# Patient Record
Sex: Male | Born: 1950
Health system: Southern US, Community
[De-identification: ages and names within clinical notes are randomized; demographics above are authoritative.]

## PROBLEM LIST (undated history)

## (undated) HISTORY — PX: HERNIA REPAIR: SHX51

## (undated) HISTORY — PX: JOINT REPLACEMENT: SHX530

---

## 2005-04-30 ENCOUNTER — Ambulatory Visit: Payer: Self-pay | Admitting: Unknown Physician Specialty

## 2005-05-16 ENCOUNTER — Ambulatory Visit: Payer: Self-pay | Admitting: Unknown Physician Specialty

## 2005-06-21 ENCOUNTER — Inpatient Hospital Stay: Payer: Self-pay | Admitting: Unknown Physician Specialty

## 2011-07-26 ENCOUNTER — Ambulatory Visit: Payer: Self-pay | Admitting: General Practice

## 2011-07-29 ENCOUNTER — Ambulatory Visit: Payer: Self-pay | Admitting: General Practice

## 2013-07-15 IMAGING — CT CT ABD-PELV W/ CM
1 of 2 series · 15 of 32 positions shown, 19 images · non-contrast
Comparison: none

REASON FOR EXAM: Wt Loss LLQ Pain
COMMENTS:

[Series 2: abd 3mm w 3.0 i40f 3 · axial · 0.79mm/px · z∈[-230,+216]mm · 15 of 163 slices shown, 19 images]
[im 7/163  soft-tissue]
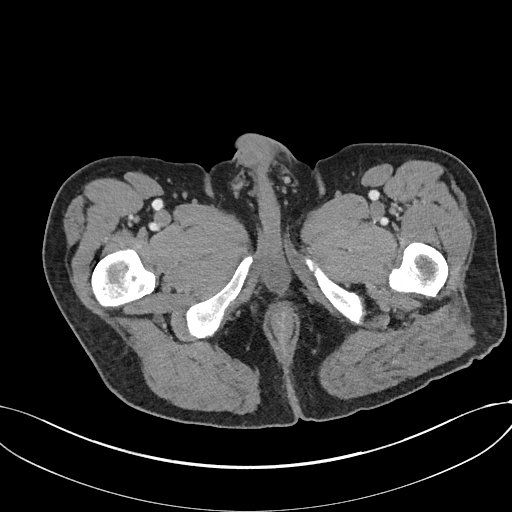
[im 7/163  bone]
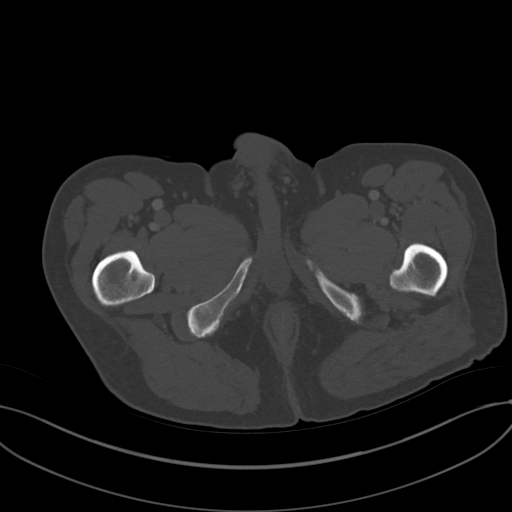
[im 19/163  soft-tissue]
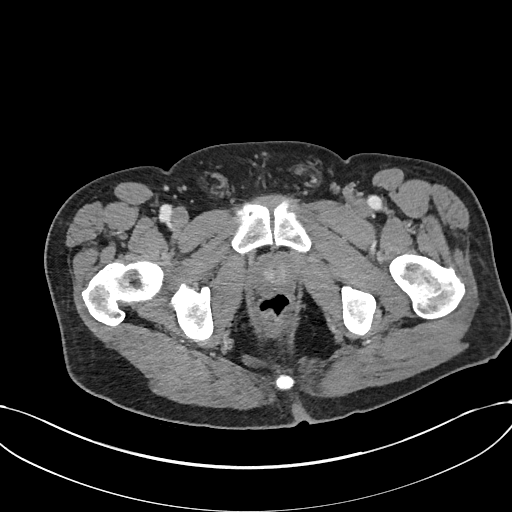
[im 32/163  soft-tissue]
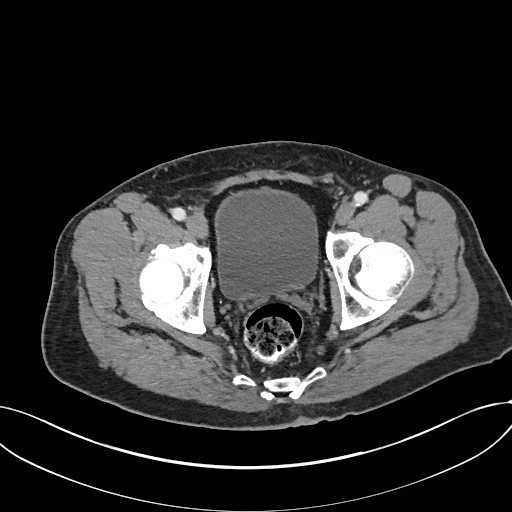
[im 44/163  soft-tissue]
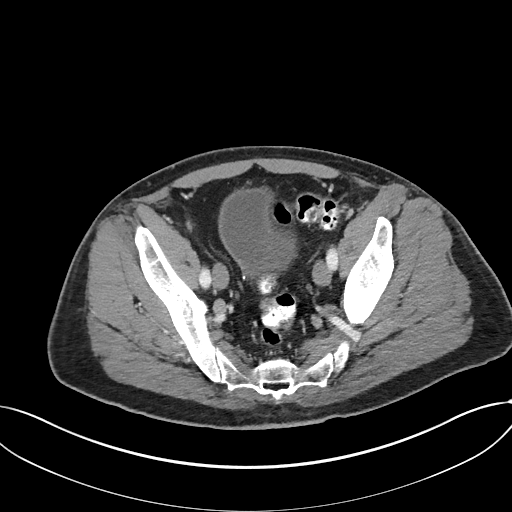
[im 57/163  soft-tissue]
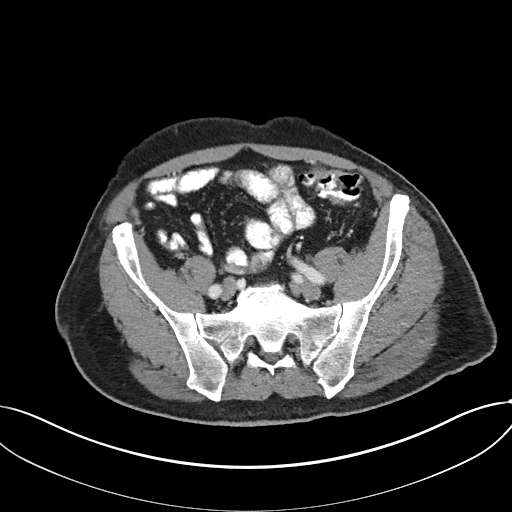
[im 69/163  soft-tissue]
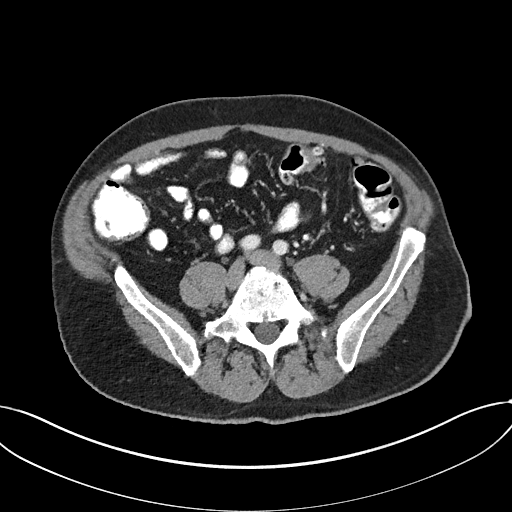
[im 82/163  soft-tissue]
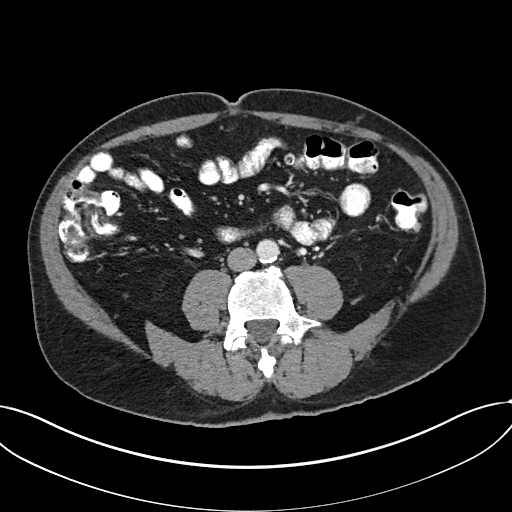
[im 94/163  soft-tissue]
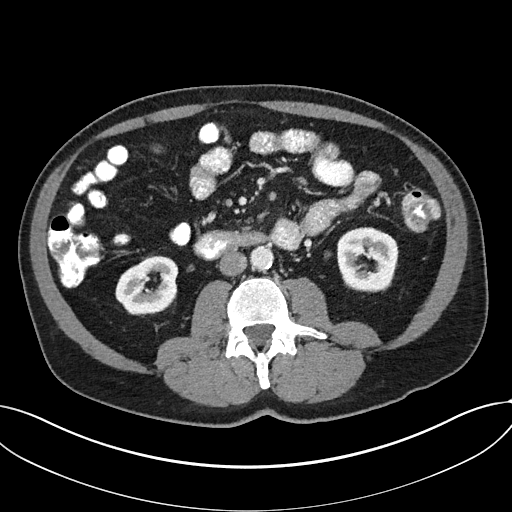
[im 106/163  soft-tissue]
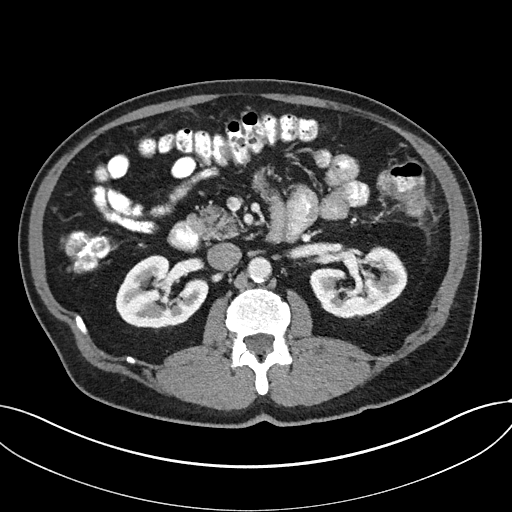
[im 106/163  bone]
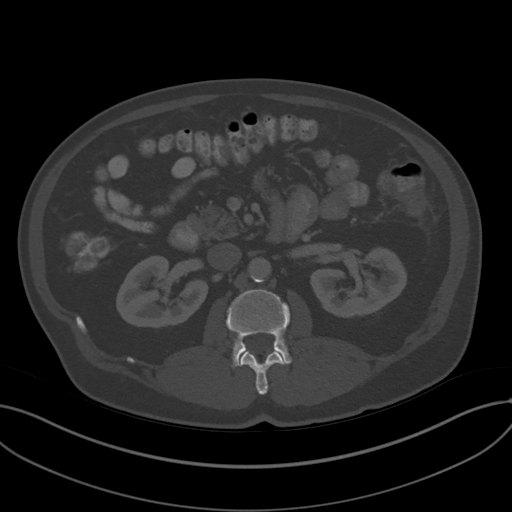
[im 119/163  soft-tissue]
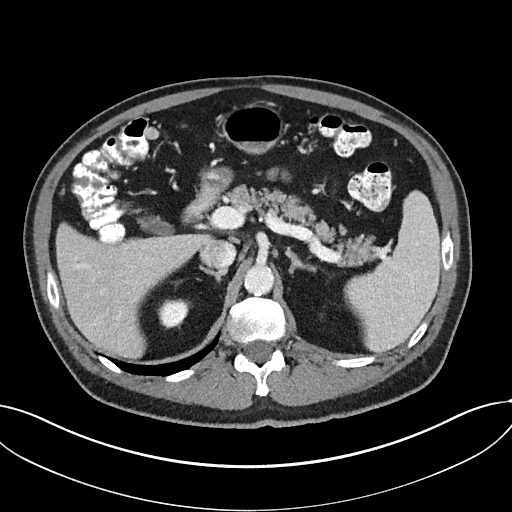
[im 131/163  soft-tissue]
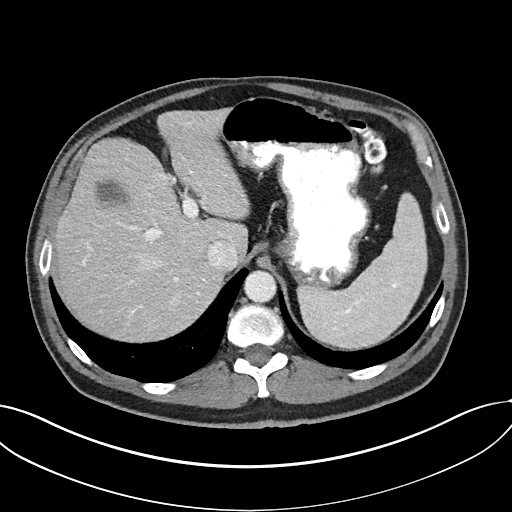
[im 138/163  lung]
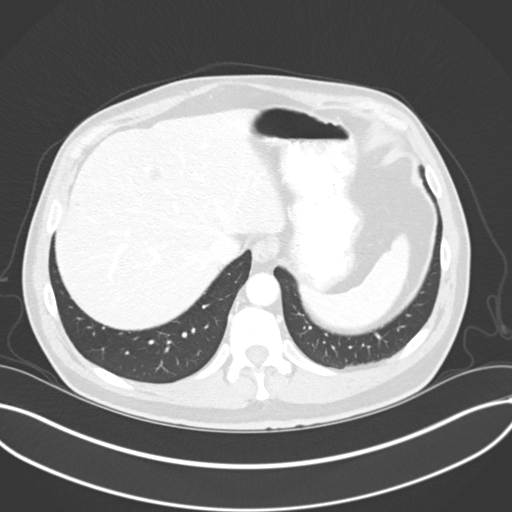
[im 144/163  soft-tissue]
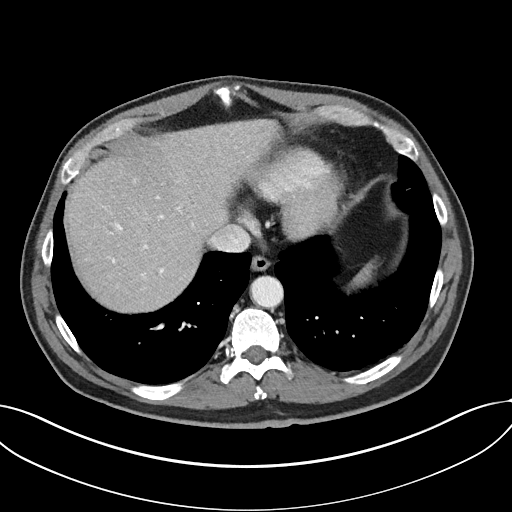
[im 144/163  lung]
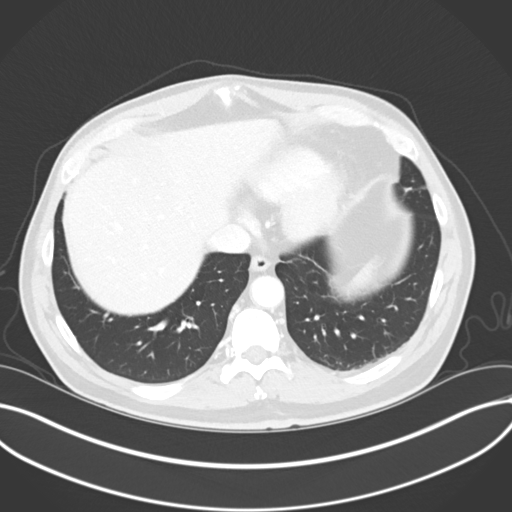
[im 150/163  lung]
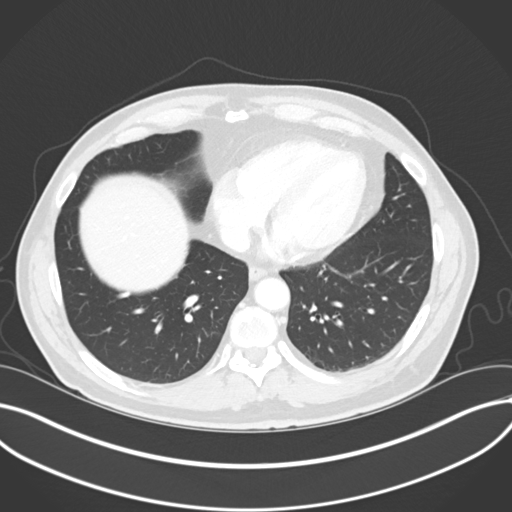
[im 156/163  soft-tissue]
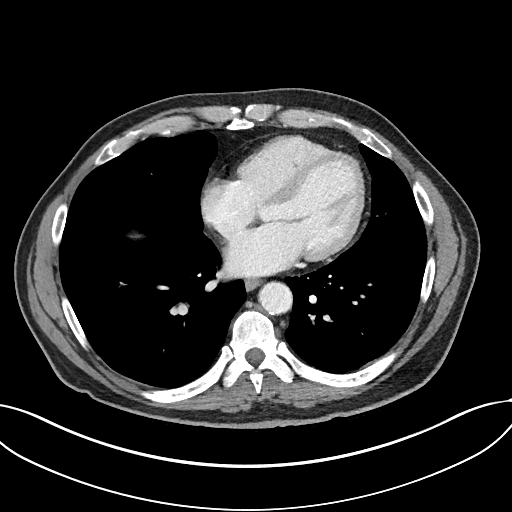
[im 156/163  lung]
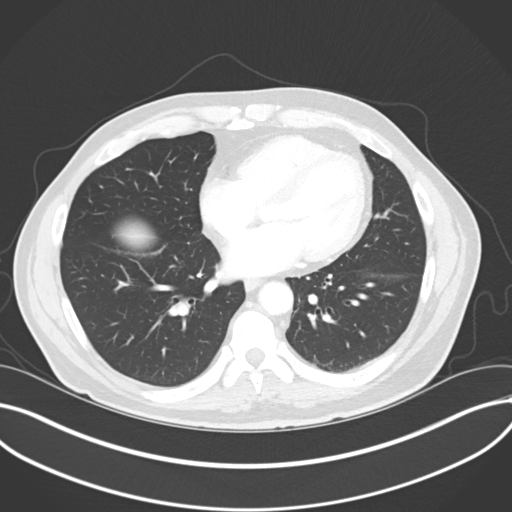

[15 of 32 positions shown; findings below may reference images not displayed]

PROCEDURE:     KCT - KCT ABDOMEN/PELVIS W  - July 29, 2011  [DATE]

RESULT:     Axial CT scanning was performed through the abdomen and pelvis
with reconstructions at 3 mm intervals and slice thicknesses. Review of
multiplanar reconstructed images was performed separately on the VIA
monitor. The patient received 100 cc of [id number] as well as oral contrast
material.

In the proximal descending colon on images 55-68 there is increased density
in the pericolonic fat that is consistent with acute diverticulitis. More
distally still there is an area of incomplete distention of the proximal
sigmoid on image 105. It does not appear obstructed and there are associated
diverticula. More proximally the colon exhibits no evidence of obstruction.
The small bowel loops are normal in appearance.

The liver exhibits scattered hypodensities most compatible with cysts. The
gallbladder is adequately distended with no evidence of stones. The
pancreas, spleen, partially distended stomach, adrenal glands, and kidneys
exhibit no acute abnormality. A mid pole hypodensity in the right kidney
exhibits Hounsfield measurement of +2 and measures 1.5 by 2 cm. The caliber
of the abdominal aorta is normal. The lung bases are clear. The lumbar
vertebral bodies are preserved in height.
IMPRESSION: 1. There are findings that suggest diverticulitis involving the proximal
descending colon. I see no evidence of abscess or perforation or
obstruction. There are scattered diverticula throughout the left colon and
there is an area of incomplete distention in the proximal sigmoid. It may be
useful to consider the patient for surgical consultation given the history
of multiple anabiotic courses.
2. I do not see acute hepatobiliary abnormality nor acute urinary tract
abnormality.

## 2015-09-12 ENCOUNTER — Inpatient Hospital Stay: Payer: BLUE CROSS/BLUE SHIELD

## 2015-09-12 ENCOUNTER — Inpatient Hospital Stay: Payer: BLUE CROSS/BLUE SHIELD | Attending: Internal Medicine | Admitting: Internal Medicine

## 2015-09-12 VITALS — BP 160/89 | HR 83 | Temp 98.9°F | Ht 68.5 in | Wt 182.1 lb

## 2015-09-12 DIAGNOSIS — D473 Essential (hemorrhagic) thrombocythemia: Secondary | ICD-10-CM

## 2015-09-12 DIAGNOSIS — M79674 Pain in right toe(s): Secondary | ICD-10-CM | POA: Insufficient documentation

## 2015-09-12 DIAGNOSIS — D75839 Thrombocytosis, unspecified: Secondary | ICD-10-CM

## 2015-09-12 DIAGNOSIS — Z79899 Other long term (current) drug therapy: Secondary | ICD-10-CM | POA: Insufficient documentation

## 2015-09-12 DIAGNOSIS — Z7982 Long term (current) use of aspirin: Secondary | ICD-10-CM | POA: Insufficient documentation

## 2015-09-12 LAB — CBC WITH DIFFERENTIAL/PLATELET
BASOS ABS: 0.1 10*3/uL (ref 0–0.1)
BASOS PCT: 1 %
EOS ABS: 0.1 10*3/uL (ref 0–0.7)
EOS PCT: 1 %
HCT: 43.9 % (ref 40.0–52.0)
HEMOGLOBIN: 14.7 g/dL (ref 13.0–18.0)
Lymphocytes Relative: 10 %
Lymphs Abs: 0.7 10*3/uL — ABNORMAL LOW (ref 1.0–3.6)
MCH: 30.5 pg (ref 26.0–34.0)
MCHC: 33.6 g/dL (ref 32.0–36.0)
MCV: 90.6 fL (ref 80.0–100.0)
Monocytes Absolute: 0.4 10*3/uL (ref 0.2–1.0)
Monocytes Relative: 5 %
NEUTROS PCT: 83 %
Neutro Abs: 6.2 10*3/uL (ref 1.4–6.5)
PLATELETS: 623 10*3/uL — AB (ref 150–440)
RBC: 4.84 MIL/uL (ref 4.40–5.90)
RDW: 13.8 % (ref 11.5–14.5)
WBC: 7.5 10*3/uL (ref 3.8–10.6)

## 2015-09-12 LAB — LACTATE DEHYDROGENASE: LDH: 152 U/L (ref 98–192)

## 2015-09-12 NOTE — Progress Notes (Signed)
[facility] CONSULT NOTE  No care team member to display  CHIEF COMPLAINTS/PURPOSE OF CONSULTATION:   # June 2015 THROMBOCYTOSIS-   HISTORY OF PRESENTING ILLNESS:  Jordan Burgess 65 y.o.  male  Patient referred to [place] for further evaluation of thrombocytosis.  On review of patient's labs-  Patient noted to have isolated thrombocytosis in June of 2015 minutes platelets of 453 [ Normal less than 380];  In February 2016 it increased to 511;  More recently February 2017-  Platelet count is 605. Patient's white count and hemoglobin is within normal limits.   Patient denies any weight loss denies any unusual fevers or chills. Denies any  Night sweats. His appetite is good.  He is currently on antibiotics for " possible diverticulitis".   otherwise no fatigue.  No history of any blood clots.  Patient does complain of crampy/ burning pain in his  Right toes for The last many months.  ROS: A complete 10 point review of system is done which is negative except mentioned above in history of present illness  MEDICAL HISTORY:  No past medical history on file.  SURGICAL HISTORY: No past surgical history on file.   SOCIAL HISTORY: no smoking, occasional alcohol.  Retired from city of [employer];  Social History   Social History  . Marital Status: Married    Spouse Name: N/A  . Number of Children: N/A  . Years of Education: N/A   Occupational History  . Not on file.   Social History Main Topics  . Smoking status: Not on file  . Smokeless tobacco: Not on file  . Alcohol Use: Not on file  . Drug Use: Not on file  . Sexual Activity: Not on file   Other Topics Concern  . Not on file   Social History Narrative  . No narrative on file    FAMILY HISTORY: No family history on file.  ALLERGIES:  has no allergies on file.  MEDICATIONS:  Current Outpatient Prescriptions  Medication Sig Dispense Refill  . aspirin 81 MG tablet Take 81 mg by mouth daily.    . ciprofloxacin  (CIPRO) 500 MG tablet Take 500 mg by mouth 2 (two) times daily. Take for 10 days for diverticulitis    . ibuprofen (ADVIL,MOTRIN) 200 MG tablet Take 200 mg by mouth every 6 (six) hours as needed.    . metroNIDAZOLE (FLAGYL) 500 MG tablet Take 500 mg by mouth 3 (three) times daily.     No current facility-administered medications for this visit.      Marland Kitchen  PHYSICAL EXAMINATION: ECOG PERFORMANCE STATUS: 0 - Asymptomatic  Filed Vitals:   09/12/15 1413  BP: 160/89  Pulse: 83  Temp: 98.9 F (37.2 C)   Filed Weights   09/12/15 1413  Weight: 182 lb 1.6 oz (82.6 kg)    GENERAL: Well-nourished well-developed; Alert, no distress and comfortable.   Alone.  EYES: no pallor or icterus OROPHARYNX: no thrush or ulceration; good dentition  NECK: supple, no masses felt LYMPH:  no palpable lymphadenopathy in the cervical, axillary or inguinal regions LUNGS: clear to auscultation and  No wheeze or crackles HEART/CVS: regular rate & rhythm and no murmurs; No lower extremity edema ABDOMEN: abdomen soft, non-tender and normal bowel sounds Musculoskeletal:no cyanosis of digits and no clubbing  PSYCH: alert & oriented x 3 with fluent speech NEURO: no focal motor/sensory deficits SKIN:  no rashes or significant lesions   ASSESSMENT & PLAN:   # THROMBOCYTOSIS-  The gradual  increase the patient's platelet count from 2015-  Most recent 605 in February 2017-  Is suggestive of primary bone marrow process like  Essential thrombocytosis.  Recommend checking CBC; LDH.  Also check jack 2 mutation/ MPL/ CALR on peripheral blood; also check bcr-abl.  Also discussed that patient re: a bone marrow biopsy/  And also CT of the abdomen and pelvis based upon  Above workup.  #  For now I recommend baby aspirin once a day.  #  Patient will follow-up with me in approximately  2 weeks or so to review the results of the above blood work/ next plan of care.  Thank you Dr.Rabinowitz for allowing me to participate in the  care of your pleasant patient. Please do not hesitate to contact me with questions or concerns in the interim.  # 30 minutes face-to-face with the patient discussing the above plan of care; more than 50% of time spent on counseling and coordination.      Cammie Sickle, MD 09/12/2015 2:38 PM

## 2015-09-12 NOTE — Progress Notes (Signed)
New patient evaluation for Thrombocytosis. Feels well. No complaints other then diverticulitis. Last colonoscopy was in June 2016.Taking antibiotics for it now. Mild abdominal tenderness at times. Hx of chronic Bilateral shoulder pain. Has had surgery in past.

## 2015-09-15 ENCOUNTER — Other Ambulatory Visit: Payer: Self-pay | Admitting: Internal Medicine

## 2015-09-15 DIAGNOSIS — D75839 Thrombocytosis, unspecified: Secondary | ICD-10-CM

## 2015-09-15 DIAGNOSIS — D473 Essential (hemorrhagic) thrombocythemia: Secondary | ICD-10-CM

## 2015-09-19 ENCOUNTER — Inpatient Hospital Stay: Payer: BLUE CROSS/BLUE SHIELD

## 2015-09-19 DIAGNOSIS — D473 Essential (hemorrhagic) thrombocythemia: Secondary | ICD-10-CM

## 2015-09-19 DIAGNOSIS — D75839 Thrombocytosis, unspecified: Secondary | ICD-10-CM

## 2015-09-19 LAB — MPL MUTATION ANALYSIS

## 2015-09-19 LAB — BCR-ABL1 FISH
CELLS COUNTED: 200
Cells Analyzed: 200

## 2015-09-22 LAB — JAK2 GENOTYPR

## 2015-09-26 ENCOUNTER — Inpatient Hospital Stay: Payer: BLUE CROSS/BLUE SHIELD | Attending: Internal Medicine | Admitting: Internal Medicine

## 2015-09-26 VITALS — BP 159/85 | HR 88 | Temp 97.1°F | Resp 18 | Wt 179.7 lb

## 2015-09-26 DIAGNOSIS — Z79899 Other long term (current) drug therapy: Secondary | ICD-10-CM | POA: Diagnosis not present

## 2015-09-26 DIAGNOSIS — R202 Paresthesia of skin: Secondary | ICD-10-CM

## 2015-09-26 DIAGNOSIS — Z7982 Long term (current) use of aspirin: Secondary | ICD-10-CM | POA: Insufficient documentation

## 2015-09-26 DIAGNOSIS — R10819 Abdominal tenderness, unspecified site: Secondary | ICD-10-CM | POA: Diagnosis not present

## 2015-09-26 DIAGNOSIS — Z792 Long term (current) use of antibiotics: Secondary | ICD-10-CM

## 2015-09-26 DIAGNOSIS — D75839 Thrombocytosis, unspecified: Secondary | ICD-10-CM

## 2015-09-26 DIAGNOSIS — F17210 Nicotine dependence, cigarettes, uncomplicated: Secondary | ICD-10-CM | POA: Insufficient documentation

## 2015-09-26 DIAGNOSIS — K5792 Diverticulitis of intestine, part unspecified, without perforation or abscess without bleeding: Secondary | ICD-10-CM | POA: Diagnosis not present

## 2015-09-26 DIAGNOSIS — R200 Anesthesia of skin: Secondary | ICD-10-CM | POA: Insufficient documentation

## 2015-09-26 DIAGNOSIS — D473 Essential (hemorrhagic) thrombocythemia: Secondary | ICD-10-CM | POA: Insufficient documentation

## 2015-09-26 NOTE — Progress Notes (Signed)
[facility] CONSULT NOTE  No care team member to display  CHIEF COMPLAINTS/PURPOSE OF CONSULTATION:   # June 2015 THROMBOCYTOSIS- [510 to 623] JAK-2 NEG;MPL-NEG; Bcr-abl-Negative.   HISTORY OF PRESENTING ILLNESS:  Jordan Burgess 65 y.o.  male  is here to discuss the results of the labs that were ordered for his thrombocytosis.  Patient is currently on antibiotics for possible diverticulitis. He has mild abdominal tenderness. Otherwise no fevers no blood in stools. Patient has intermittent/once in a while tingling and numbness sensation in his feet bilaterally.  Oherwise no fatigue.  No history of any blood clots.    ROS: A complete 10 point review of system is done which is negative except mentioned above in history of present illness  MEDICAL HISTORY:  No past medical history on file.  SURGICAL HISTORY: No past surgical history on file.   SOCIAL HISTORY: no smoking, occasional alcohol.  Retired from city of [employer];  Social History   Social History  . Marital Status: Married    Spouse Name: N/A  . Number of Children: N/A  . Years of Education: N/A   Occupational History  . Not on file.   Social History Main Topics  . Smoking status: Not on file  . Smokeless tobacco: Not on file  . Alcohol Use: Not on file  . Drug Use: Not on file  . Sexual Activity: Not on file   Other Topics Concern  . Not on file   Social History Narrative  . No narrative on file    FAMILY HISTORY: No family history on file.  ALLERGIES:  has no allergies on file.  MEDICATIONS:  Current Outpatient Prescriptions  Medication Sig Dispense Refill  . aspirin 81 MG tablet Take 81 mg by mouth daily.    . ciprofloxacin (CIPRO) 500 MG tablet Take 500 mg by mouth 2 (two) times daily. Take for 10 days for diverticulitis    . ibuprofen (ADVIL,MOTRIN) 200 MG tablet Take 200 mg by mouth every 6 (six) hours as needed.    . metroNIDAZOLE (FLAGYL) 500 MG tablet Take 500 mg by mouth 3  (three) times daily.     No current facility-administered medications for this visit.      Marland Kitchen  PHYSICAL EXAMINATION: ECOG PERFORMANCE STATUS: 0 - Asymptomatic  Filed Vitals:   09/26/15 1058  BP: 159/85  Pulse: 88  Temp: 97.1 F (36.2 C)  Resp: 18   Filed Weights   09/26/15 1058  Weight: 179 lb 10.8 oz (81.5 kg)    GENERAL: Well-nourished well-developed; Alert, no distress and comfortable.   Accompanied by his wife.  EYES: no pallor or icterus OROPHARYNX: no thrush or ulceration; good dentition  NECK: supple, no masses felt LYMPH:  no palpable lymphadenopathy in the cervical, axillary or inguinal regions LUNGS: clear to auscultation and  No wheeze or crackles HEART/CVS: regular rate & rhythm and no murmurs; No lower extremity edema ABDOMEN: abdomen soft, non-tender and normal bowel sounds Musculoskeletal:no cyanosis of digits and no clubbing  PSYCH: alert & oriented x 3 with fluent speech NEURO: no focal motor/sensory deficits SKIN:  no rashes or significant lesions   ASSESSMENT & PLAN:   # THROMBOCYTOSIS-  The gradual increase the patient's platelet count from 2015-  Most recent 605 in February 2017; repeat platelets were 623-  Is suggestive of primary bone marrow process like  Essential thrombocytosis; less likely myelofibrosis. Barnabas Lister 2/MPL negative; BCR ABL negative.   Recommend a bone marrow biopsy- in radiology. Discussed  the potential complications of bleeding/ infection and pain with the procedure. He prefers to have it only under local anesthesia.   # Patient follow-up with me in approximately 2 weeks after the procedure.  # 15 minutes face-to-face with the patient discussing the above plan of care; more than 50% of time spent on natural history; counseling and coordination.      Cammie Sickle, MD 09/26/2015 11:23 AM

## 2015-09-26 NOTE — Progress Notes (Signed)
Pt here for thrombocytosis. Here to receive all of his lab  Results. Pt currently being Tx  For diverticulitis. Appetite of coarse has been low.Has abd pain rates it a 3/10.

## 2015-09-28 ENCOUNTER — Telehealth: Payer: Self-pay | Admitting: *Deleted

## 2015-09-28 NOTE — Telephone Encounter (Signed)
Called pt to tell him about BM bx dat of 10/02/15 [facility] and have bx at 11 am.  He had already been contacted and gave instructions of what to do by scheduling earlier this afternoon.  I did tell him that he can cont. [facility]a.  I also asked if he had a ride and his wife is bring him. [facility] and he know nothing to eat or dink 8 hours before proc.I have made him a f/u appt in [place] office because he only lives 10 min from there.  10/16/15 11 am to see  Dr. Jacinto Reap  I have sent message to Mickel Baas to make the appt and he wants it in [place].I sent message because pt was seen in [place] and the bm bx was ordered from [facility] and I got a message that laura needs to know when the bx was scheduled so she can make f/u appt.

## 2015-09-29 ENCOUNTER — Other Ambulatory Visit: Payer: Self-pay | Admitting: General Surgery

## 2015-10-02 ENCOUNTER — Ambulatory Visit
Admission: RE | Admit: 2015-10-02 | Discharge: 2015-10-02 | Disposition: A | Discharge status: Home / Self Care | Payer: BLUE CROSS/BLUE SHIELD | Source: Ambulatory Visit | Attending: Internal Medicine | Admitting: Internal Medicine

## 2015-10-02 DIAGNOSIS — D473 Essential (hemorrhagic) thrombocythemia: Secondary | ICD-10-CM | POA: Diagnosis present

## 2015-10-02 DIAGNOSIS — D75839 Thrombocytosis, unspecified: Secondary | ICD-10-CM

## 2015-10-02 LAB — CBC
HCT: 44.1 % (ref 40.0–52.0)
HEMOGLOBIN: 15.1 g/dL (ref 13.0–18.0)
MCH: 31 pg (ref 26.0–34.0)
MCHC: 34.2 g/dL (ref 32.0–36.0)
MCV: 90.5 fL (ref 80.0–100.0)
Platelets: 736 10*3/uL — ABNORMAL HIGH (ref 150–440)
RBC: 4.87 MIL/uL (ref 4.40–5.90)
RDW: 13.6 % (ref 11.5–14.5)
WBC: 6.3 10*3/uL (ref 3.8–10.6)

## 2015-10-02 LAB — DIFFERENTIAL
BASOS ABS: 0.1 10*3/uL (ref 0–0.1)
Basophils Relative: 1 %
Eosinophils Absolute: 0.1 10*3/uL (ref 0–0.7)
Eosinophils Relative: 2 %
LYMPHS ABS: 0.8 10*3/uL — AB (ref 1.0–3.6)
LYMPHS PCT: 14 %
Monocytes Absolute: 0.4 10*3/uL (ref 0.2–1.0)
Monocytes Relative: 6 %
NEUTROS ABS: 4.9 10*3/uL (ref 1.4–6.5)
NEUTROS PCT: 77 %

## 2015-10-02 LAB — PROTIME-INR
INR: 1.14
Prothrombin Time: 14.8 seconds (ref 11.4–15.0)

## 2015-10-02 LAB — APTT: APTT: 29 s (ref 24–36)

## 2015-10-02 MED ORDER — MIDAZOLAM HCL 5 MG/5ML IJ SOLN
INTRAMUSCULAR | Status: AC | PRN
  Administered 2015-10-02: 1 mg via INTRAVENOUS

## 2015-10-02 MED ORDER — HYDROCODONE-ACETAMINOPHEN 5-325 MG PO TABS
1.0000 | ORAL_TABLET | ORAL | Status: DC | PRN
  Filled 2015-10-02: qty 2

## 2015-10-02 MED ORDER — SODIUM CHLORIDE 0.9 % IV SOLN
INTRAVENOUS | Status: DC
  Administered 2015-10-02: 11:00:00 via INTRAVENOUS

## 2015-10-02 MED ORDER — FENTANYL CITRATE (PF) 100 MCG/2ML IJ SOLN
INTRAMUSCULAR | Status: AC | PRN
  Administered 2015-10-02: 50 ug via INTRAVENOUS

## 2015-10-02 MED ORDER — FENTANYL CITRATE (PF) 100 MCG/2ML IJ SOLN
INTRAMUSCULAR | Status: AC
  Filled 2015-10-02: qty 4

## 2015-10-02 MED ORDER — MIDAZOLAM HCL 5 MG/5ML IJ SOLN
INTRAMUSCULAR | Status: AC
  Filled 2015-10-02: qty 5

## 2015-10-02 MED ORDER — HEPARIN SOD (PORK) LOCK FLUSH 100 UNIT/ML IV SOLN
INTRAVENOUS | Status: AC
  Filled 2015-10-02: qty 5

## 2015-10-02 NOTE — Procedures (Signed)
Under CT guidance, bone marrow aspirate and core biopsy of right iliac bone was performed. No immediate complications.

## 2015-10-02 NOTE — Procedures (Signed)
Procedure and risks discussed with patient and wife. Informed consent was obtained. Will perform CT-guided bone marrow biopsy.

## 2015-10-16 ENCOUNTER — Inpatient Hospital Stay (HOSPITAL_BASED_OUTPATIENT_CLINIC_OR_DEPARTMENT_OTHER): Payer: BLUE CROSS/BLUE SHIELD | Admitting: Internal Medicine

## 2015-10-16 VITALS — BP 151/84 | HR 71 | Temp 96.6°F | Resp 16 | Wt 184.1 lb

## 2015-10-16 DIAGNOSIS — D75839 Thrombocytosis, unspecified: Secondary | ICD-10-CM

## 2015-10-16 DIAGNOSIS — F17210 Nicotine dependence, cigarettes, uncomplicated: Secondary | ICD-10-CM

## 2015-10-16 DIAGNOSIS — Z7982 Long term (current) use of aspirin: Secondary | ICD-10-CM | POA: Diagnosis not present

## 2015-10-16 DIAGNOSIS — Z79899 Other long term (current) drug therapy: Secondary | ICD-10-CM

## 2015-10-16 DIAGNOSIS — D473 Essential (hemorrhagic) thrombocythemia: Secondary | ICD-10-CM

## 2015-10-16 MED ORDER — HYDROXYUREA 500 MG PO CAPS: 500.0000 mg | ORAL_CAPSULE | Freq: Every day | ORAL | Status: DC

## 2015-10-16 NOTE — Progress Notes (Signed)
Patient is here to discuss test results.

## 2015-10-16 NOTE — Progress Notes (Signed)
[facility] NOTE  Patient Care Team: Karen Kitchens, MD as PCP - General (Family Medicine)  CHIEF COMPLAINTS/PURPOSE OF CONSULTATION:   # MARCH 2017- ESSENTIAL THROMBOCYTOSIS-CALR- POSITIVE; Intermediate risk [March 2017- BMBx- megakaryocytosis; mild reticulin fibrosis ; platelets- 510 to 623- June 2015] JAK-2 NEG;MPL-NEG; Bcr-abl-Negative.CALR MUTATION POSITIVE. [id number]- START Hydrea 58m/day  HISTORY OF PRESENTING ILLNESS:  Jordan TESTERMAN89y.o.  male  is here to discuss the results of the workup including bone marrow biopsy that was ordered for his elevated platelets.   Bone marrow biopsy was uneventful. No history of any blood clots.  Patient is on aspirin once a day.  ROS: A complete 10 point review of system is done which is negative except mentioned above in history of present illness  MEDICAL HISTORY:  No past medical history on file.  SURGICAL HISTORY: Past Surgical History  Procedure Laterality Date  . Joint replacement    . Hernia repair Left      SOCIAL HISTORY: no smoking, occasional alcohol.  Retired from city of [employer]  Social History   Social History  . Marital Status: Married    Spouse Name: N/A  . Number of Children: N/A  . Years of Education: N/A   Occupational History  . Not on file.   Social History Main Topics  . Smoking status: Current Every Day Smoker -- 0.25 packs/day for 5 years  . Smokeless tobacco: Former [employer] developer . Alcohol Use: 4.2 oz/week    7 Cans of beer per week  . Drug Use: No  . Sexual Activity: Not on file   Other Topics Concern  . Not on file   Social History Narrative  . No narrative on file    FAMILY HISTORY: No family history on file.  ALLERGIES:  has No Known Allergies.  MEDICATIONS:  Current Outpatient Prescriptions  Medication Sig Dispense Refill  . aspirin 81 MG tablet Take 81 mg by mouth daily.    .Marland Kitchenibuprofen (ADVIL,MOTRIN) 200 MG tablet Take 200 mg by mouth every 6 (six)  hours as needed.    . hydroxyurea (HYDREA) 500 MG capsule Take 1 capsule (500 mg total) by mouth daily. May take with food to minimize GI side effects. 90 capsule 1   No current facility-administered medications for this visit.      .Marland Kitchen PHYSICAL EXAMINATION: ECOG PERFORMANCE STATUS: 0 - Asymptomatic  Filed Vitals:   10/16/15 1108  BP: 151/84  Pulse: 71  Temp: 96.6 F (35.9 C)  Resp: 16   Filed Weights   10/16/15 1108  Weight: 184 lb 1.4 oz (83.5 kg)    GENERAL: Well-nourished well-developed; Alert, no distress and comfortable.   Accompanied by his wife.  EYES: no pallor or icterus OROPHARYNX: no thrush or ulceration; good dentition  NECK: supple, no masses felt LYMPH:  no palpable lymphadenopathy in the cervical, axillary or inguinal regions LUNGS: clear to auscultation and  No wheeze or crackles HEART/CVS: regular rate & rhythm and no murmurs; No lower extremity edema ABDOMEN: abdomen soft, non-tender and normal bowel sounds Musculoskeletal:no cyanosis of digits and no clubbing  PSYCH: alert & oriented x 3 with fluent speech NEURO: no focal motor/sensory deficits SKIN:  no rashes or significant lesions   ASSESSMENT & PLAN:   #  ESSENTIALTHROMBOCYTOSIS- CALR mutation positive; status post bone marrow biopsy. I discussed the natural history of essential thrombocytosis in detail; including the risk of strokes; and the extremely small risk of transformation acute leukemias. In general  most patients live essentially normal life.  # Given his age/ falls into the intermediate risk category- recommend Hydrea once a day. Discussed the potential side effects including but not limited to oral ulcers sores on the leg; skin rash. The risk of nausea vomiting; hair loss significant bone marrow depression is small. Also discussed the role of anagrelide; however I would recommend Hydrea as the first choice.  # Recommend checking CBC CMP in 1 month; CBC monthly and follow-up with me in 3  months with CBC/CMP.  # 25 minutes face-to-face with the patient discussing the above plan of care; more than 50% of time spent on prognosis/ natural history; counseling and coordination.     Cammie Sickle, MD 10/16/2015 11:43 AM

## 2015-11-16 ENCOUNTER — Inpatient Hospital Stay: Payer: BLUE CROSS/BLUE SHIELD | Attending: Internal Medicine

## 2015-11-16 DIAGNOSIS — D473 Essential (hemorrhagic) thrombocythemia: Secondary | ICD-10-CM | POA: Diagnosis present

## 2015-11-16 DIAGNOSIS — D75839 Thrombocytosis, unspecified: Secondary | ICD-10-CM

## 2015-11-16 LAB — CBC WITH DIFFERENTIAL/PLATELET
BASOS ABS: 0 10*3/uL (ref 0–0.1)
Basophils Relative: 1 %
Eosinophils Absolute: 0.1 10*3/uL (ref 0–0.7)
Eosinophils Relative: 1 %
HEMATOCRIT: 40.6 % (ref 40.0–52.0)
HEMOGLOBIN: 14.3 g/dL (ref 13.0–18.0)
LYMPHS PCT: 11 %
Lymphs Abs: 0.8 10*3/uL — ABNORMAL LOW (ref 1.0–3.6)
MCH: 32.8 pg (ref 26.0–34.0)
MCHC: 35.3 g/dL (ref 32.0–36.0)
MCV: 92.9 fL (ref 80.0–100.0)
MONO ABS: 0.4 10*3/uL (ref 0.2–1.0)
MONOS PCT: 6 %
NEUTROS ABS: 6.1 10*3/uL (ref 1.4–6.5)
Neutrophils Relative %: 81 %
Platelets: 560 10*3/uL — ABNORMAL HIGH (ref 150–440)
RBC: 4.37 MIL/uL — AB (ref 4.40–5.90)
RDW: 15.5 % — ABNORMAL HIGH (ref 11.5–14.5)
WBC: 7.5 10*3/uL (ref 3.8–10.6)

## 2015-11-16 LAB — COMPREHENSIVE METABOLIC PANEL
ALK PHOS: 44 U/L (ref 38–126)
ALT: 18 U/L (ref 17–63)
AST: 28 U/L (ref 15–41)
Albumin: 4.4 g/dL (ref 3.5–5.0)
Anion gap: 7 (ref 5–15)
BILIRUBIN TOTAL: 1.2 mg/dL (ref 0.3–1.2)
BUN: 14 mg/dL (ref 6–20)
CO2: 27 mmol/L (ref 22–32)
CREATININE: 0.84 mg/dL (ref 0.61–1.24)
Calcium: 9 mg/dL (ref 8.9–10.3)
Chloride: 106 mmol/L (ref 101–111)
GFR calc Af Amer: >60 mL/min (ref >60)
Glucose, Bld: 103 mg/dL — ABNORMAL HIGH (ref 65–99)
Potassium: 4.3 mmol/L (ref 3.5–5.1)
Sodium: 140 mmol/L (ref 135–145)
TOTAL PROTEIN: 7.2 g/dL (ref 6.5–8.1)

## 2015-11-17 ENCOUNTER — Telehealth: Payer: Self-pay | Admitting: *Deleted

## 2015-11-17 NOTE — Telephone Encounter (Signed)
-----   Message from Cammie Sickle, MD sent at 11/16/2015  5:01 PM EDT ----- Please inform patient that platelets are coming down/improving. No new changes at this time; follow up as planned.

## 2015-11-17 NOTE — Telephone Encounter (Signed)
Called patient to inform him that platelets are coming down/inproving.  MD will follow up as planned.

## 2015-12-19 ENCOUNTER — Telehealth: Payer: Self-pay | Admitting: *Deleted

## 2015-12-19 ENCOUNTER — Inpatient Hospital Stay: Payer: BLUE CROSS/BLUE SHIELD | Attending: Internal Medicine

## 2015-12-19 DIAGNOSIS — D473 Essential (hemorrhagic) thrombocythemia: Secondary | ICD-10-CM | POA: Diagnosis present

## 2015-12-19 DIAGNOSIS — D75839 Thrombocytosis, unspecified: Secondary | ICD-10-CM

## 2015-12-19 LAB — CBC WITH DIFFERENTIAL/PLATELET
BASOS ABS: 0.1 10*3/uL (ref 0–0.1)
BASOS PCT: 1 %
EOS PCT: 2 %
Eosinophils Absolute: 0.1 10*3/uL (ref 0–0.7)
HCT: 43.8 % (ref 40.0–52.0)
Hemoglobin: 15 g/dL (ref 13.0–18.0)
Lymphocytes Relative: 15 %
Lymphs Abs: 0.8 10*3/uL — ABNORMAL LOW (ref 1.0–3.6)
MCH: 32.8 pg (ref 26.0–34.0)
MCHC: 34.2 g/dL (ref 32.0–36.0)
MCV: 95.9 fL (ref 80.0–100.0)
MONO ABS: 0.4 10*3/uL (ref 0.2–1.0)
Monocytes Relative: 7 %
NEUTROS ABS: 4.3 10*3/uL (ref 1.4–6.5)
Neutrophils Relative %: 75 %
PLATELETS: 508 10*3/uL — AB (ref 150–440)
RBC: 4.56 MIL/uL (ref 4.40–5.90)
RDW: 16.3 % — AB (ref 11.5–14.5)
WBC: 5.6 10*3/uL (ref 3.8–10.6)

## 2015-12-19 LAB — COMPREHENSIVE METABOLIC PANEL
ALBUMIN: 4.4 g/dL (ref 3.5–5.0)
ALT: 15 U/L — ABNORMAL LOW (ref 17–63)
AST: 23 U/L (ref 15–41)
Alkaline Phosphatase: 44 U/L (ref 38–126)
Anion gap: 6 (ref 5–15)
BUN: 15 mg/dL (ref 6–20)
CHLORIDE: 104 mmol/L (ref 101–111)
CO2: 29 mmol/L (ref 22–32)
Calcium: 8.9 mg/dL (ref 8.9–10.3)
Creatinine, Ser: 0.9 mg/dL (ref 0.61–1.24)
GFR calc Af Amer: >60 mL/min (ref >60)
GFR calc non Af Amer: >60 mL/min (ref >60)
GLUCOSE: 105 mg/dL — AB (ref 65–99)
POTASSIUM: 4.7 mmol/L (ref 3.5–5.1)
SODIUM: 139 mmol/L (ref 135–145)
Total Bilirubin: 0.8 mg/dL (ref 0.3–1.2)
Total Protein: 7.2 g/dL (ref 6.5–8.1)

## 2015-12-19 NOTE — Telephone Encounter (Signed)
Spoke with patient. He was able to see his lab results via mychart today. plts are improving. Pt instructed to continue current dose of hydrea.  Teach back process performed with patient.

## 2015-12-19 NOTE — Telephone Encounter (Signed)
-----   Message from Cammie Sickle, MD sent at 12/19/2015 11:14 AM EDT ----- Please inform patient that platelets are improving; continue current dose of Hydrea; follow up as planned.

## 2016-01-17 ENCOUNTER — Inpatient Hospital Stay: Payer: BLUE CROSS/BLUE SHIELD | Attending: Family Medicine

## 2016-01-17 ENCOUNTER — Inpatient Hospital Stay: Payer: BLUE CROSS/BLUE SHIELD

## 2016-01-17 ENCOUNTER — Inpatient Hospital Stay: Payer: BLUE CROSS/BLUE SHIELD | Attending: Oncology | Admitting: Oncology

## 2016-01-17 VITALS — BP 148/81 | HR 69 | Temp 96.4°F | Resp 18 | Wt 183.2 lb

## 2016-01-17 DIAGNOSIS — Z7982 Long term (current) use of aspirin: Secondary | ICD-10-CM | POA: Diagnosis not present

## 2016-01-17 DIAGNOSIS — D473 Essential (hemorrhagic) thrombocythemia: Secondary | ICD-10-CM | POA: Insufficient documentation

## 2016-01-17 DIAGNOSIS — D75839 Thrombocytosis, unspecified: Secondary | ICD-10-CM

## 2016-01-17 DIAGNOSIS — F17210 Nicotine dependence, cigarettes, uncomplicated: Secondary | ICD-10-CM | POA: Diagnosis not present

## 2016-01-17 DIAGNOSIS — Z79899 Other long term (current) drug therapy: Secondary | ICD-10-CM | POA: Diagnosis not present

## 2016-01-17 LAB — CBC WITH DIFFERENTIAL/PLATELET
BASOS ABS: 0.1 10*3/uL (ref 0–0.1)
BASOS PCT: 1 %
EOS ABS: 0.1 10*3/uL (ref 0–0.7)
EOS PCT: 2 %
HCT: 45.6 % (ref 40.0–52.0)
Hemoglobin: 15.9 g/dL (ref 13.0–18.0)
Lymphocytes Relative: 14 %
Lymphs Abs: 0.8 10*3/uL — ABNORMAL LOW (ref 1.0–3.6)
MCH: 33.5 pg (ref 26.0–34.0)
MCHC: 34.9 g/dL (ref 32.0–36.0)
MCV: 96.1 fL (ref 80.0–100.0)
Monocytes Absolute: 0.4 10*3/uL (ref 0.2–1.0)
Monocytes Relative: 7 %
Neutro Abs: 4.3 10*3/uL (ref 1.4–6.5)
Neutrophils Relative %: 76 %
PLATELETS: 547 10*3/uL — AB (ref 150–440)
RBC: 4.75 MIL/uL (ref 4.40–5.90)
RDW: 15.1 % — ABNORMAL HIGH (ref 11.5–14.5)
WBC: 5.6 10*3/uL (ref 3.8–10.6)

## 2016-01-17 LAB — COMPREHENSIVE METABOLIC PANEL
ALT: 16 U/L — AB (ref 17–63)
AST: 25 U/L (ref 15–41)
Albumin: 4.4 g/dL (ref 3.5–5.0)
Alkaline Phosphatase: 47 U/L (ref 38–126)
Anion gap: 8 (ref 5–15)
BUN: 13 mg/dL (ref 6–20)
CHLORIDE: 103 mmol/L (ref 101–111)
CO2: 28 mmol/L (ref 22–32)
Calcium: 9.1 mg/dL (ref 8.9–10.3)
Creatinine, Ser: 0.84 mg/dL (ref 0.61–1.24)
GFR calc Af Amer: >60 mL/min (ref >60)
GFR calc non Af Amer: >60 mL/min (ref >60)
Glucose, Bld: 99 mg/dL (ref 65–99)
POTASSIUM: 4.8 mmol/L (ref 3.5–5.1)
SODIUM: 139 mmol/L (ref 135–145)
Total Bilirubin: 0.4 mg/dL (ref 0.3–1.2)
Total Protein: 7.2 g/dL (ref 6.5–8.1)

## 2016-01-17 MED ORDER — HYDROXYUREA 500 MG PO CAPS: 500.0000 mg | ORAL_CAPSULE | Freq: Two times a day (BID) | ORAL | Status: DC

## 2016-01-17 NOTE — Progress Notes (Signed)
Patient presents for follow up

## 2016-01-17 NOTE — Progress Notes (Signed)
[facility]  [phone]  [phone]     DRU PRIMEAU DOB: 01/22/1951  MR#: [id number]  LTJ#:[id number]  Patient Care Team: Karen Kitchens, MD as PCP - General (Family Medicine)  CHIEF COMPLAINT:  Chief Complaint  Patient presents with  . thrombocytosis   # MARCH 2017- ESSENTIAL THROMBOCYTOSIS-CALR- POSITIVE; Intermediate risk [March 2017- BMBx- megakaryocytosis; mild reticulin fibrosis ; platelets- 510 to 623- June 2015] JAK-2 NEG;MPL-NEG; Bcr-abl-Negative.CALR MUTATION POSITIVE. [id number]- START Hydrea 524m/day - June 2017 Increase Hydrea 502mBID  INTERVAL HISTORY: Patient returns to clinic today for evaluation and review labs. He currently feels well. He notices that he bleeds/bruises easy in the past few months. He does drink 2-3 beers daily. Otherwise no problems.    REVIEW OF SYSTEMS:   Review of Systems  Constitutional: Negative.   HENT: Negative.   Eyes: Negative.   Respiratory: Negative.   Cardiovascular: Negative.   Gastrointestinal: Negative.   Genitourinary: Negative.   Musculoskeletal: Negative.   Skin: Negative.   Neurological: Negative.   Endo/Heme/Allergies: Bruises/bleeds easily.  Psychiatric/Behavioral: Negative.     As per HPI. Otherwise, a complete review of systems is negatve.  ONCOLOGY HISTORY:  No history exists.    PAST MEDICAL HISTORY: No past medical history on file.  PAST SURGICAL HISTORY: Past Surgical History  Procedure Laterality Date  . Joint replacement    . Hernia repair Left     FAMILY HISTORY No family history on file.    ADVANCED DIRECTIVES:    HEALTH MAINTENANCE: Social History  Substance Use Topics  . Smoking status: Current Every Day Smoker -- 0.25 packs/day for 5 years  . Smokeless tobacco: Former [employer] developer. Alcohol Use: 4.2 oz/week    7 Cans of beer per week    No Known Allergies  Current Outpatient Prescriptions  Medication Sig Dispense Refill  . aspirin 81 MG  tablet Take 81 mg by mouth daily.    . Marland Kitchenabapentin (NEURONTIN) 100 MG capsule Take 100 mg by mouth as needed.    . hydroxyurea (HYDREA) 500 MG capsule Take 1 capsule (500 mg total) by mouth daily. May take with food to minimize GI side effects. 90 capsule 1  . ibuprofen (ADVIL,MOTRIN) 200 MG tablet Take 200 mg by mouth every 6 (six) hours as needed.     No current facility-administered medications for this visit.    OBJECTIVE: BP 148/81 mmHg  Pulse 69  Temp(Src) 96.4 F (35.8 C) (Tympanic)  Resp 18  Wt 183 lb 3.2 oz (83.1 kg)   Body mass index is 27.86 kg/(m^2).    ECOG FS:0 - Asymptomatic  GENERAL: Well-nourished well-developed; Alert, no distress and comfortable. Accompanied by his wife.  EYES: no pallor or icterus OROPHARYNX: no thrush or ulceration; good dentition  NECK: supple, no masses felt LUNGS: clear to auscultation and No wheeze or crackles HEART/CVS: regular rate & rhythm and no murmurs; No lower extremity edema Musculoskeletal:no cyanosis of digits and no clubbing  PSYCH: alert & oriented x 3 with fluent speech NEURO: no focal motor/sensory deficits SKIN: no rashes or significant lesions   LAB RESULTS:  Appointment on 01/17/2016  Component Date Value Ref Range Status  . WBC 01/17/2016 5.6  3.8 - 10.6 K/uL Final  . RBC 01/17/2016 4.75  4.40 - 5.90 MIL/uL Final  . Hemoglobin 01/17/2016 15.9  13.0 - 18.0 g/dL Final  . HCT 01/17/2016 45.6  40.0 - 52.0 % Final  . MCV 01/17/2016 96.1  80.0 - 100.0 fL Final  .  MCH 01/17/2016 33.5  26.0 - 34.0 pg Final  . MCHC 01/17/2016 34.9  32.0 - 36.0 g/dL Final  . RDW 01/17/2016 15.1* 11.5 - 14.5 % Final  . Platelets 01/17/2016 547* 150 - 440 K/uL Final  . Neutrophils Relative % 01/17/2016 76   Final  . Neutro Abs 01/17/2016 4.3  1.4 - 6.5 K/uL Final  . Lymphocytes Relative 01/17/2016 14   Final  . Lymphs Abs 01/17/2016 0.8* 1.0 - 3.6 K/uL Final  . Monocytes Relative 01/17/2016 7   Final  . Monocytes Absolute 01/17/2016  0.4  0.2 - 1.0 K/uL Final  . Eosinophils Relative 01/17/2016 2   Final  . Eosinophils Absolute 01/17/2016 0.1  0 - 0.7 K/uL Final  . Basophils Relative 01/17/2016 1   Final  . Basophils Absolute 01/17/2016 0.1  0 - 0.1 K/uL Final  . Sodium 01/17/2016 139  135 - 145 mmol/L Final  . Potassium 01/17/2016 4.8  3.5 - 5.1 mmol/L Final  . Chloride 01/17/2016 103  101 - 111 mmol/L Final  . CO2 01/17/2016 28  22 - 32 mmol/L Final  . Glucose, Bld 01/17/2016 99  65 - 99 mg/dL Final  . BUN 01/17/2016 13  6 - 20 mg/dL Final  . Creatinine, Ser 01/17/2016 0.84  0.61 - 1.24 mg/dL Final  . Calcium 01/17/2016 9.1  8.9 - 10.3 mg/dL Final  . Total Protein 01/17/2016 7.2  6.5 - 8.1 g/dL Final  . Albumin 01/17/2016 4.4  3.5 - 5.0 g/dL Final  . AST 01/17/2016 25  15 - 41 U/L Final  . ALT 01/17/2016 16* 17 - 63 U/L Final  . Alkaline Phosphatase 01/17/2016 47  38 - 126 U/L Final  . Total Bilirubin 01/17/2016 0.4  0.3 - 1.2 mg/dL Final  . GFR calc non Af Amer 01/17/2016 >60  >60 mL/min Final  . GFR calc Af Amer 01/17/2016 >60  >60 mL/min Final   Comment: (NOTE) The eGFR has been calculated using the CKD EPI equation. This calculation has not been validated in all clinical situations. eGFR's persistently <60 mL/min signify possible Chronic Kidney Disease.   . Anion gap 01/17/2016 8  5 - 15 Final    STUDIES: No results found.  ASSESSMENT & PLAN:   1.  ESSENTIALTHROMBOCYTOSIS- CALR mutation positive; status post bone marrow biopsy. I discussed the natural history of essential thrombocytosis in detail; including the risk of strokes; and the extremely small risk of transformation acute leukemias. In general most patients live essentially normal life. Platelets up from 508 to 547 today. Will increase hydrea to 500 mg BID. Prescription sent to pharmacy.  2. Recommend checking CBC CMP in 1 month; CBC monthly and follow-up with me in 3 months with CBC/CMP.   Patient expressed understanding and was in agreement  with this plan. He also understands that He can call clinic at any time with any questions, concerns, or complaints.   Dr. Rogue Bussing was available for consultation and review of plan of care for this patient.  Mayra Reel, NP   01/17/2016 10:55 AM

## 2016-01-19 ENCOUNTER — Other Ambulatory Visit: Payer: Self-pay | Admitting: *Deleted

## 2016-01-19 ENCOUNTER — Encounter: Payer: Self-pay | Admitting: *Deleted

## 2016-01-19 DIAGNOSIS — D75839 Thrombocytosis, unspecified: Secondary | ICD-10-CM

## 2016-01-19 DIAGNOSIS — D473 Essential (hemorrhagic) thrombocythemia: Secondary | ICD-10-CM

## 2016-01-19 MED ORDER — HYDROXYUREA 500 MG PO CAPS: 500.0000 mg | ORAL_CAPSULE | Freq: Two times a day (BID) | ORAL | Status: DC

## 2016-01-19 NOTE — Progress Notes (Signed)
rn received msg from [facility] that hydrea needed prior auth.  Clinical key in covermymeds [id number].  rn attempted to perform prior auth for this drug. Notification in covermymeds that prior auth no longer needed and was cnl by [employer].  Msg received that patient was able to back by [facility] on 01/19/16 at 1154am. Per pharmacy: patient did pick up RX at [facility], but insurance is requesting he use speciality pharmacy-. He currently has no preference on a particular pharmacy.  [facility] states that patient stated biologics would be fine.   rx for hydrea submitted to [facility]ologics.  Pending biologics response.

## 2016-01-25 ENCOUNTER — Encounter: Payer: Self-pay | Admitting: *Deleted

## 2016-01-25 NOTE — Progress Notes (Signed)
md rcvd fax form [facility]. rx for [facility]rea [facility] to [facility] (which is in the patient's insurance/employer's network).  [facility] phone: [phone].  Fax: [phone].

## 2016-02-14 ENCOUNTER — Inpatient Hospital Stay: Payer: BLUE CROSS/BLUE SHIELD | Attending: Internal Medicine

## 2016-02-14 DIAGNOSIS — C473 Malignant neoplasm of peripheral nerves of thorax: Secondary | ICD-10-CM | POA: Diagnosis not present

## 2016-02-14 DIAGNOSIS — D473 Essential (hemorrhagic) thrombocythemia: Secondary | ICD-10-CM

## 2016-02-14 DIAGNOSIS — D75839 Thrombocytosis, unspecified: Secondary | ICD-10-CM

## 2016-02-14 LAB — CBC WITH DIFFERENTIAL/PLATELET
BASOS ABS: 0 10*3/uL (ref 0–0.1)
BASOS PCT: 0 %
EOS ABS: 0.1 10*3/uL (ref 0–0.7)
EOS PCT: 1 %
HCT: 42.7 % (ref 40.0–52.0)
Hemoglobin: 15.1 g/dL (ref 13.0–18.0)
Lymphocytes Relative: 15 %
Lymphs Abs: 0.9 10*3/uL — ABNORMAL LOW (ref 1.0–3.6)
MCH: 34.7 pg — ABNORMAL HIGH (ref 26.0–34.0)
MCHC: 35.3 g/dL (ref 32.0–36.0)
MCV: 98.2 fL (ref 80.0–100.0)
MONO ABS: 0.5 10*3/uL (ref 0.2–1.0)
Monocytes Relative: 9 %
NEUTROS ABS: 4.4 10*3/uL (ref 1.4–6.5)
Neutrophils Relative %: 75 %
PLATELETS: 384 10*3/uL (ref 150–440)
RBC: 4.35 MIL/uL — ABNORMAL LOW (ref 4.40–5.90)
RDW: 15 % — AB (ref 11.5–14.5)
WBC: 5.9 10*3/uL (ref 3.8–10.6)

## 2016-02-20 ENCOUNTER — Telehealth: Payer: Self-pay

## 2016-02-20 NOTE — Telephone Encounter (Signed)
Patient wanted results of last blood work had not posted to my chart.  Patient given platelet count over the phone

## 2016-03-13 ENCOUNTER — Inpatient Hospital Stay: Payer: BLUE CROSS/BLUE SHIELD | Attending: Internal Medicine

## 2016-03-13 ENCOUNTER — Telehealth: Payer: Self-pay | Admitting: *Deleted

## 2016-03-13 DIAGNOSIS — D473 Essential (hemorrhagic) thrombocythemia: Secondary | ICD-10-CM | POA: Insufficient documentation

## 2016-03-13 DIAGNOSIS — D75839 Thrombocytosis, unspecified: Secondary | ICD-10-CM

## 2016-03-13 LAB — CBC WITH DIFFERENTIAL/PLATELET
BASOS PCT: 1 %
Basophils Absolute: 0.1 10*3/uL (ref 0–0.1)
EOS ABS: 0.1 10*3/uL (ref 0–0.7)
EOS PCT: 2 %
HEMATOCRIT: 42.5 % (ref 40.0–52.0)
Hemoglobin: 14.9 g/dL (ref 13.0–18.0)
Lymphocytes Relative: 21 %
Lymphs Abs: 0.7 10*3/uL — ABNORMAL LOW (ref 1.0–3.6)
MCH: 35.8 pg — ABNORMAL HIGH (ref 26.0–34.0)
MCHC: 35.1 g/dL (ref 32.0–36.0)
MCV: 101.9 fL — ABNORMAL HIGH (ref 80.0–100.0)
MONOS PCT: 10 %
Monocytes Absolute: 0.3 10*3/uL (ref 0.2–1.0)
Neutro Abs: 2.3 10*3/uL (ref 1.4–6.5)
Neutrophils Relative %: 66 %
Platelets: 304 10*3/uL (ref 150–440)
RBC: 4.17 MIL/uL — ABNORMAL LOW (ref 4.40–5.90)
RDW: 18.1 % — AB (ref 11.5–14.5)
WBC: 3.5 10*3/uL — ABNORMAL LOW (ref 3.8–10.6)

## 2016-03-13 NOTE — Telephone Encounter (Signed)
-----   Message from Cammie Sickle, MD sent at 03/13/2016  1:19 PM EDT ----- Labs look okay- continue current hydrea/ follow up as planned. Please inform pt. Thx

## 2016-03-13 NOTE — Telephone Encounter (Signed)
Pt contacted. Teach back process performed. Asked pt to keep taking same dose of hydrea. Follow-up as planned.  I reviewed his plt count result with him.

## 2016-04-15 ENCOUNTER — Other Ambulatory Visit: Payer: Self-pay

## 2016-04-15 DIAGNOSIS — D473 Essential (hemorrhagic) thrombocythemia: Secondary | ICD-10-CM

## 2016-04-15 DIAGNOSIS — D75839 Thrombocytosis, unspecified: Secondary | ICD-10-CM

## 2016-04-17 ENCOUNTER — Inpatient Hospital Stay: Payer: BLUE CROSS/BLUE SHIELD | Attending: Internal Medicine | Admitting: Internal Medicine

## 2016-04-17 ENCOUNTER — Inpatient Hospital Stay: Payer: BLUE CROSS/BLUE SHIELD

## 2016-04-17 DIAGNOSIS — F17210 Nicotine dependence, cigarettes, uncomplicated: Secondary | ICD-10-CM | POA: Insufficient documentation

## 2016-04-17 DIAGNOSIS — D473 Essential (hemorrhagic) thrombocythemia: Secondary | ICD-10-CM | POA: Diagnosis present

## 2016-04-17 DIAGNOSIS — Z79899 Other long term (current) drug therapy: Secondary | ICD-10-CM | POA: Diagnosis not present

## 2016-04-17 DIAGNOSIS — Z7982 Long term (current) use of aspirin: Secondary | ICD-10-CM | POA: Diagnosis not present

## 2016-04-17 DIAGNOSIS — D75839 Thrombocytosis, unspecified: Secondary | ICD-10-CM

## 2016-04-17 DIAGNOSIS — D474 Osteomyelofibrosis: Secondary | ICD-10-CM | POA: Insufficient documentation

## 2016-04-17 LAB — COMPREHENSIVE METABOLIC PANEL
ALK PHOS: 50 U/L (ref 38–126)
ALT: 15 U/L — AB (ref 17–63)
AST: 29 U/L (ref 15–41)
Albumin: 4.3 g/dL (ref 3.5–5.0)
Anion gap: 5 (ref 5–15)
BUN: 17 mg/dL (ref 6–20)
CALCIUM: 9 mg/dL (ref 8.9–10.3)
CHLORIDE: 104 mmol/L (ref 101–111)
CO2: 29 mmol/L (ref 22–32)
CREATININE: 0.95 mg/dL (ref 0.61–1.24)
GFR calc non Af Amer: >60 mL/min (ref >60)
GLUCOSE: 123 mg/dL — AB (ref 65–99)
Potassium: 4.8 mmol/L (ref 3.5–5.1)
SODIUM: 138 mmol/L (ref 135–145)
Total Bilirubin: 1 mg/dL (ref 0.3–1.2)
Total Protein: 7.4 g/dL (ref 6.5–8.1)

## 2016-04-17 LAB — CBC WITH DIFFERENTIAL/PLATELET
BASOS ABS: 0 10*3/uL (ref 0–0.1)
BASOS PCT: 1 %
EOS ABS: 0.1 10*3/uL (ref 0–0.7)
Eosinophils Relative: 1 %
HCT: 42 % (ref 40.0–52.0)
Hemoglobin: 15.1 g/dL (ref 13.0–18.0)
Lymphocytes Relative: 18 %
Lymphs Abs: 0.7 10*3/uL — ABNORMAL LOW (ref 1.0–3.6)
MCH: 38.6 pg — ABNORMAL HIGH (ref 26.0–34.0)
MCHC: 35.9 g/dL (ref 32.0–36.0)
MCV: 107.6 fL — ABNORMAL HIGH (ref 80.0–100.0)
MONO ABS: 0.3 10*3/uL (ref 0.2–1.0)
MONOS PCT: 6 %
NEUTROS PCT: 74 %
Neutro Abs: 3.1 10*3/uL (ref 1.4–6.5)
Platelets: 324 10*3/uL (ref 150–440)
RBC: 3.9 MIL/uL — ABNORMAL LOW (ref 4.40–5.90)
RDW: 17.6 % — AB (ref 11.5–14.5)
WBC: 4.2 10*3/uL (ref 3.8–10.6)

## 2016-04-17 MED ORDER — HYDROXYUREA 500 MG PO CAPS: 500.0000 mg | ORAL_CAPSULE | Freq: Two times a day (BID) | ORAL | 3 refills | Status: DC

## 2016-04-17 NOTE — Progress Notes (Signed)
[facility] NOTE  Patient Care Team: Karen Kitchens, MD as PCP - General (Family Medicine)  CHIEF COMPLAINTS/PURPOSE OF CONSULTATION:   # MARCH 2017- ESSENTIAL THROMBOCYTOSIS-CALR- POSITIVE; Intermediate risk [March 2017- BMBx- megakaryocytosis; mild reticulin fibrosis ; platelets- 510 to 623- June 2015] JAK-2 NEG;MPL-NEG; Bcr-abl-Negative.CALR MUTATION POSITIVE. [id number] START Hydrea 500mg  BID  HISTORY OF PRESENTING ILLNESS:  Jordan Burgess 65 y.o.  male  is with diagnosis of ET is here for a follow up. Patient currently on Hydrea twice a day. Tolerating well no sores in the mouth no nausea no vomiting.   No history of any blood clots.  Patient is on aspirin once a day.  ROS: A complete 10 point review of system is done which is negative except mentioned above in history of present illness  MEDICAL HISTORY:  ET No past medical history on file.  SURGICAL HISTORY: Past Surgical History:  Procedure Laterality Date  . HERNIA REPAIR Left   . JOINT REPLACEMENT       SOCIAL HISTORY: no smoking, occasional alcohol.  Retired from city of [employer];  Social History   Social History  . Marital status: Married    Spouse name: N/A  . Number of children: N/A  . Years of education: N/A   Occupational History  . Not on file.   Social History Main Topics  . Smoking status: Current Every Day Smoker    Packs/day: 0.25    Years: 5.00  . Smokeless tobacco: Former Systems developer  . Alcohol use 4.2 oz/week    7 Cans of beer per week  . Drug use: No  . Sexual activity: Not on file   Other Topics Concern  . Not on file   Social History Narrative  . No narrative on file    FAMILY HISTORY: No family history on file.  ALLERGIES:  has No Known Allergies.  MEDICATIONS:  Current Outpatient Prescriptions  Medication Sig Dispense Refill  . aspirin 81 MG tablet Take 81 mg by mouth daily.    . chlorhexidine (PERIDEX) 0.12 % solution   0  . gabapentin (NEURONTIN)  100 MG capsule Take 100 mg by mouth as needed.    . hydroxyurea (HYDREA) 500 MG capsule Take 1 capsule (500 mg total) by mouth 2 (two) times daily. May take with food to minimize GI side effects. 60 capsule 3  . ibuprofen (ADVIL,MOTRIN) 200 MG tablet Take 200 mg by mouth every 6 (six) hours as needed.     No current facility-administered medications for this visit.       Marland Kitchen  PHYSICAL EXAMINATION: ECOG PERFORMANCE STATUS: 0 - Asymptomatic  Vitals:   04/17/16 0839  BP: 127/83  Pulse: 85  Resp: 16  Temp: (!) 96.4 F (35.8 C)   Filed Weights   04/17/16 0839  Weight: 187 lb (84.8 kg)    GENERAL: Well-nourished well-developed; Alert, no distress and comfortable.   He is alone.   EYES: no pallor or icterus OROPHARYNX: no thrush or ulceration; good dentition  NECK: supple, no masses felt LYMPH:  no palpable lymphadenopathy in the cervical, axillary or inguinal regions LUNGS: clear to auscultation and  No wheeze or crackles HEART/CVS: regular rate & rhythm and no murmurs; No lower extremity edema ABDOMEN: abdomen soft, non-tender and normal bowel sounds Musculoskeletal:no cyanosis of digits and no clubbing  PSYCH: alert & oriented x 3 with fluent speech NEURO: no focal motor/sensory deficits SKIN:  no rashes or significant lesions   ASSESSMENT & PLAN:  Essential thrombocytosis (HCC) #  ESSENTIALTHROMBOCYTOSIS- CALR mutation positive; on Hydrea 500 twice a day. Most recent platelets approximately 1 month ago was around 324. Continues to be an aspirin. Today- platelets 287/ hb 15/wbc- 3.8.   # Recommend follow-up in 4 months cbc/cmp.        Cammie Sickle, MD 04/17/2016 4:37 PM

## 2016-04-17 NOTE — Assessment & Plan Note (Addendum)
#    ESSENTIALTHROMBOCYTOSIS- CALR mutation positive; on Hydrea 500 twice a day. Most recent platelets approximately 1 month ago was around 324. Continues to be an aspirin. Today- platelets 287/ hb 15/wbc- 3.8.   # Recommend follow-up in 4 months cbc/cmp.

## 2016-04-17 NOTE — Progress Notes (Signed)
Has dental infection recent antibiotics

## 2016-04-17 NOTE — Progress Notes (Signed)
[facility]ydrea rx [facility]ed to [facility]  1 [phone]

## 2016-04-30 ENCOUNTER — Telehealth: Payer: Self-pay | Admitting: *Deleted

## 2016-04-30 NOTE — Telephone Encounter (Signed)
What about the fact that his platelet count went up, his note says results were 268, but lab results show it was actually 324. He is concerned that it went up after his hydrea was increased

## 2016-04-30 NOTE — Telephone Encounter (Signed)
Dr. Rogue Bussing said patient may receive his flu shot.

## 2016-04-30 NOTE — Telephone Encounter (Signed)
Have patient stay on current dose hydrea 500 mg twice daily.  Keep scheduled apt in January 2017.

## 2016-04-30 NOTE — Telephone Encounter (Signed)
Asking for lab results and if he can have a flu shot. Please advise

## 2016-04-30 NOTE — Telephone Encounter (Signed)
Patient informed of MD answer and he thanked me for getting back to him

## 2016-05-15 ENCOUNTER — Inpatient Hospital Stay: Payer: BLUE CROSS/BLUE SHIELD

## 2016-07-11 ENCOUNTER — Telehealth: Payer: Self-pay | Admitting: *Deleted

## 2016-07-11 DIAGNOSIS — D473 Essential (hemorrhagic) thrombocythemia: Secondary | ICD-10-CM

## 2016-07-11 DIAGNOSIS — D75839 Thrombocytosis, unspecified: Secondary | ICD-10-CM

## 2016-07-11 MED ORDER — HYDROXYUREA 500 MG PO CAPS: 500.0000 mg | ORAL_CAPSULE | Freq: Two times a day (BID) | ORAL | 0 refills | Status: DC

## 2016-07-11 NOTE — Telephone Encounter (Signed)
Has gone on vacation and forgot his hydrea, asking rx be sent to [facility] in [place]. Rx sent for 2 week supply

## 2016-08-19 ENCOUNTER — Inpatient Hospital Stay: Payer: Medicare Other | Attending: Internal Medicine

## 2016-08-19 ENCOUNTER — Inpatient Hospital Stay (HOSPITAL_BASED_OUTPATIENT_CLINIC_OR_DEPARTMENT_OTHER): Payer: Medicare Other | Admitting: Internal Medicine

## 2016-08-19 DIAGNOSIS — D72819 Decreased white blood cell count, unspecified: Secondary | ICD-10-CM | POA: Diagnosis not present

## 2016-08-19 DIAGNOSIS — Z7982 Long term (current) use of aspirin: Secondary | ICD-10-CM | POA: Diagnosis not present

## 2016-08-19 DIAGNOSIS — Z79899 Other long term (current) drug therapy: Secondary | ICD-10-CM | POA: Insufficient documentation

## 2016-08-19 DIAGNOSIS — F17210 Nicotine dependence, cigarettes, uncomplicated: Secondary | ICD-10-CM | POA: Diagnosis not present

## 2016-08-19 DIAGNOSIS — D473 Essential (hemorrhagic) thrombocythemia: Secondary | ICD-10-CM

## 2016-08-19 DIAGNOSIS — D75839 Thrombocytosis, unspecified: Secondary | ICD-10-CM

## 2016-08-19 LAB — COMPREHENSIVE METABOLIC PANEL
ALBUMIN: 4.2 g/dL (ref 3.5–5.0)
ALK PHOS: 44 U/L (ref 38–126)
ALT: 15 U/L — ABNORMAL LOW (ref 17–63)
ANION GAP: 6 (ref 5–15)
AST: 26 U/L (ref 15–41)
BILIRUBIN TOTAL: 0.8 mg/dL (ref 0.3–1.2)
BUN: 13 mg/dL (ref 6–20)
CALCIUM: 9.1 mg/dL (ref 8.9–10.3)
CO2: 30 mmol/L (ref 22–32)
Chloride: 103 mmol/L (ref 101–111)
Creatinine, Ser: 0.98 mg/dL (ref 0.61–1.24)
GFR calc Af Amer: >60 mL/min (ref >60)
GLUCOSE: 120 mg/dL — AB (ref 65–99)
POTASSIUM: 4.9 mmol/L (ref 3.5–5.1)
Sodium: 139 mmol/L (ref 135–145)
TOTAL PROTEIN: 6.9 g/dL (ref 6.5–8.1)

## 2016-08-19 LAB — CBC WITH DIFFERENTIAL/PLATELET
Basophils Absolute: 0 10*3/uL (ref 0–0.1)
Basophils Relative: 1 %
Eosinophils Absolute: 0 10*3/uL (ref 0–0.7)
Eosinophils Relative: 1 %
HEMATOCRIT: 43.6 % (ref 40.0–52.0)
HEMOGLOBIN: 15.5 g/dL (ref 13.0–18.0)
LYMPHS PCT: 29 %
Lymphs Abs: 0.6 10*3/uL — ABNORMAL LOW (ref 1.0–3.6)
MCH: 39.3 pg — ABNORMAL HIGH (ref 26.0–34.0)
MCHC: 35.6 g/dL (ref 32.0–36.0)
MCV: 110.5 fL — AB (ref 80.0–100.0)
MONOS PCT: 14 %
Monocytes Absolute: 0.3 10*3/uL (ref 0.2–1.0)
NEUTROS ABS: 1.1 10*3/uL — AB (ref 1.4–6.5)
NEUTROS PCT: 55 %
Platelets: 202 10*3/uL (ref 150–440)
RBC: 3.95 MIL/uL — ABNORMAL LOW (ref 4.40–5.90)
RDW: 12.2 % (ref 11.5–14.5)
WBC: 2 10*3/uL — ABNORMAL LOW (ref 3.8–10.6)

## 2016-08-19 MED ORDER — HYDROXYUREA 500 MG PO CAPS: 500.0000 mg | ORAL_CAPSULE | Freq: Every day | ORAL | 3 refills | Status: DC

## 2016-08-19 NOTE — Assessment & Plan Note (Addendum)
#    ESSENTIALTHROMBOCYTOSIS- CALR mutation positive; Continues to be an aspirin. Today- platelets 202/ hb 15.5/wbc- 2.0. Will reduce Hydrea to once daily (500 mg) and an additional dose (500 mg) three times a week M, W, F due to low WBC's (2.0)  # Recommend follow-up in 3 months cbc/cmp.

## 2016-08-19 NOTE — Progress Notes (Signed)
Patient has recovered from flu like symptoms. Patient still has cold like symptoms. Currently taking hydrea 500 mg twice. Mild Leukopenia noted on labs. Wbc 2.0; anc 1.1  Patient recently moved to the [facility]. He would still like to keep coming back to cancer center and coordinating his visits with his family vacation. He has not yet established with pcp.

## 2016-08-19 NOTE — Progress Notes (Signed)
[name] NOTE  Patient Care Team: Karen Kitchens, MD as PCP - General (Family Medicine)  CHIEF COMPLAINTS/PURPOSE OF CONSULTATION:   # MARCH 2017- ESSENTIAL THROMBOCYTOSIS-CALR- POSITIVE; Intermediate risk [March 2017- BMBx- megakaryocytosis; mild reticulin fibrosis ; platelets- 510 to 623- June 2015] JAK-2 NEG;MPL-NEG; Bcr-abl-Negative.CALR MUTATION POSITIVE. [id number]- START Hydrea 500mg  BID  HISTORY OF PRESENTING ILLNESS:  Jordan Burgess 66 y.o.  male  is with diagnosis of ET is here for a follow up. Patient currently on Hydrea twice a day. Tolerating well no sores in the mouth, no rash on soles/feet, no nausea and no vomiting. He just recently moved to the [facility] but would like to continue care with [place], as he has family that live in the area.    No history of any blood clots.  Patient is on aspirin once a day.  ROS: A complete 10 point review of system is done which is negative except mentioned above in history of present illness  MEDICAL HISTORY:  ET No past medical history on file.  SURGICAL HISTORY: Past Surgical History:  Procedure Laterality Date  . HERNIA REPAIR Left   . JOINT REPLACEMENT       SOCIAL HISTORY: no smoking, occasional alcohol.  Retired from city of [employer];  Social History   Social History  . Marital status: Married    Spouse name: N/A  . Number of children: N/A  . Years of education: N/A   Occupational History  . Not on file.   Social History Main Topics  . Smoking status: Current Every Day Smoker    Packs/day: 0.25    Years: 5.00  . Smokeless tobacco: Former Systems developer  . Alcohol use 4.2 oz/week    7 Cans of beer per week  . Drug use: No  . Sexual activity: Not on file   Other Topics Concern  . Not on file   Social History Narrative  . No narrative on file    FAMILY HISTORY: No family history on file.  ALLERGIES:  has No Known Allergies.  MEDICATIONS:  Current Outpatient Prescriptions   Medication Sig Dispense Refill  . aspirin 81 MG tablet Take 81 mg by mouth daily.    . chlorhexidine (PERIDEX) 0.12 % solution Use as directed 5 mLs in the mouth or throat 2 (two) times daily.   0  . hydroxyurea (HYDREA) 500 MG capsule Take 1 capsule (500 mg total) by mouth daily. Take one tablet (500 mg) daily. Take an additional (500 mg) three times per week (M,W,F). May take with food to minimize GI side effects. 60 capsule 3  . ibuprofen (ADVIL,MOTRIN) 200 MG tablet Take 200 mg by mouth every 6 (six) hours as needed.    Marland Kitchen amoxicillin (AMOXIL) 500 MG capsule Take 1 capsule by mouth as needed. During Tooth extractions  0  . gabapentin (NEURONTIN) 100 MG capsule Take 100 mg by mouth as needed.      No current facility-administered medications for this visit.       Marland Kitchen  PHYSICAL EXAMINATION: ECOG PERFORMANCE STATUS: 0 - Asymptomatic  Vitals:   08/19/16 1008  BP: 136/87  Pulse: 82  Resp: 18  Temp: 97.6 F (36.4 C)   Filed Weights   08/19/16 1008  Weight: 188 lb (85.3 kg)    GENERAL: Well-nourished well-developed; Alert, no distress and comfortable.   He is alone.   EYES: no pallor or icterus OROPHARYNX: no thrush or ulceration; good dentition  NECK: supple, no masses  felt LYMPH:  no palpable lymphadenopathy in the cervical, axillary or inguinal regions LUNGS: clear to auscultation and  No wheeze or crackles HEART/CVS: regular rate & rhythm and no murmurs; No lower extremity edema ABDOMEN: abdomen soft, non-tender and normal bowel sounds Musculoskeletal:no cyanosis of digits and no clubbing  PSYCH: alert & oriented x 3 with fluent speech NEURO: no focal motor/sensory deficits SKIN:  no rashes or significant lesions   ASSESSMENT & PLAN:   Essential thrombocytosis (HCC) #  ESSENTIALTHROMBOCYTOSIS- CALR mutation positive; Continues to be an aspirin. Today- platelets 202/ hb 15.5/wbc- 2.0. Will reduce Hydrea to once daily (500 mg) and an additional dose (500 mg) three times a  week M, W, F due to low WBC's (2.0)  # Recommend follow-up in 3 months cbc/cmp.    Faythe Casa, NP   Jacquelin Hawking, NP 08/19/2016 1:06 PM

## 2016-10-31 ENCOUNTER — Other Ambulatory Visit: Payer: Self-pay | Admitting: Internal Medicine

## 2016-10-31 DIAGNOSIS — D75839 Thrombocytosis, unspecified: Secondary | ICD-10-CM

## 2016-10-31 DIAGNOSIS — D473 Essential (hemorrhagic) thrombocythemia: Secondary | ICD-10-CM

## 2016-11-18 ENCOUNTER — Inpatient Hospital Stay: Payer: Medicare Other | Attending: Internal Medicine | Admitting: Internal Medicine

## 2016-11-18 ENCOUNTER — Inpatient Hospital Stay: Payer: Medicare Other

## 2016-11-18 ENCOUNTER — Other Ambulatory Visit: Payer: Self-pay | Admitting: *Deleted

## 2016-11-18 VITALS — BP 154/89 | HR 77 | Temp 97.2°F | Resp 18 | Ht 68.0 in | Wt 192.0 lb

## 2016-11-18 DIAGNOSIS — D1809 Hemangioma of other sites: Secondary | ICD-10-CM | POA: Diagnosis not present

## 2016-11-18 DIAGNOSIS — D473 Essential (hemorrhagic) thrombocythemia: Secondary | ICD-10-CM | POA: Insufficient documentation

## 2016-11-18 DIAGNOSIS — F17210 Nicotine dependence, cigarettes, uncomplicated: Secondary | ICD-10-CM | POA: Diagnosis not present

## 2016-11-18 DIAGNOSIS — E875 Hyperkalemia: Secondary | ICD-10-CM | POA: Insufficient documentation

## 2016-11-18 DIAGNOSIS — D696 Thrombocytopenia, unspecified: Secondary | ICD-10-CM

## 2016-11-18 DIAGNOSIS — D75839 Thrombocytosis, unspecified: Secondary | ICD-10-CM

## 2016-11-18 DIAGNOSIS — Z79899 Other long term (current) drug therapy: Secondary | ICD-10-CM | POA: Diagnosis not present

## 2016-11-18 DIAGNOSIS — Z7982 Long term (current) use of aspirin: Secondary | ICD-10-CM | POA: Diagnosis not present

## 2016-11-18 LAB — CBC WITH DIFFERENTIAL/PLATELET
BASOS ABS: 0.1 10*3/uL (ref 0–0.1)
BASOS PCT: 1 %
EOS ABS: 0 10*3/uL (ref 0–0.7)
EOS PCT: 1 %
HCT: 44 % (ref 40.0–52.0)
Hemoglobin: 15.6 g/dL (ref 13.0–18.0)
Lymphocytes Relative: 15 %
Lymphs Abs: 0.8 10*3/uL — ABNORMAL LOW (ref 1.0–3.6)
MCH: 36.8 pg — ABNORMAL HIGH (ref 26.0–34.0)
MCHC: 35.5 g/dL (ref 32.0–36.0)
MCV: 103.8 fL — ABNORMAL HIGH (ref 80.0–100.0)
MONO ABS: 0.3 10*3/uL (ref 0.2–1.0)
Monocytes Relative: 7 %
Neutro Abs: 4 10*3/uL (ref 1.4–6.5)
Neutrophils Relative %: 76 %
PLATELETS: 506 10*3/uL — AB (ref 150–440)
RBC: 4.24 MIL/uL — ABNORMAL LOW (ref 4.40–5.90)
RDW: 12.1 % (ref 11.5–14.5)
WBC: 5.2 10*3/uL (ref 3.8–10.6)

## 2016-11-18 LAB — COMPREHENSIVE METABOLIC PANEL
ALT: 16 U/L — ABNORMAL LOW (ref 17–63)
AST: 24 U/L (ref 15–41)
Albumin: 4.3 g/dL (ref 3.5–5.0)
Alkaline Phosphatase: 45 U/L (ref 38–126)
Anion gap: 5 (ref 5–15)
BUN: 12 mg/dL (ref 6–20)
CHLORIDE: 105 mmol/L (ref 101–111)
CO2: 29 mmol/L (ref 22–32)
Calcium: 9.4 mg/dL (ref 8.9–10.3)
Creatinine, Ser: 0.89 mg/dL (ref 0.61–1.24)
GFR calc Af Amer: >60 mL/min (ref >60)
Glucose, Bld: 99 mg/dL (ref 65–99)
POTASSIUM: 5.3 mmol/L — AB (ref 3.5–5.1)
SODIUM: 139 mmol/L (ref 135–145)
Total Bilirubin: 0.6 mg/dL (ref 0.3–1.2)
Total Protein: 7.5 g/dL (ref 6.5–8.1)

## 2016-11-18 MED ORDER — HYDROXYUREA 500 MG PO CAPS: ORAL_CAPSULE | ORAL | 1 refills | Status: DC

## 2016-11-18 NOTE — Progress Notes (Signed)
[facility] NOTE  Patient Care Team: Karen Kitchens, MD as PCP - General (Family Medicine)  CHIEF COMPLAINTS/PURPOSE OF CONSULTATION:   # MARCH 2017- ESSENTIAL THROMBOCYTOSIS-CALR- POSITIVE; Intermediate risk [March 2017- BMBx- megakaryocytosis; mild reticulin fibrosis ; platelets- 510 to 623- June 2015] JAK-2 NEG;MPL-NEG; Bcr-abl-Negative.CALR MUTATION POSITIVE. [id number]- START Hydrea 500mg  BID  HISTORY OF PRESENTING ILLNESS:  Jordan Burgess 66 y.o.  male  is with diagnosis of ET is here for a follow up. Patient is currently on Hydrea once a day.   He just recently moved to the [facility] but would like to continue care with [place], as he has family that live in the area. No history of any blood clots.  Patient is on aspirin once a day. Denies any fevers or chills. Denies any history of melanomas.   ROS: A complete 10 point review of system is done which is negative except mentioned above in history of present illness  MEDICAL HISTORY:  ET No past medical history on file.  SURGICAL HISTORY: Past Surgical History:  Procedure Laterality Date  . HERNIA REPAIR Left   . JOINT REPLACEMENT       SOCIAL HISTORY: no smoking, occasional alcohol.  Retired from city of [employer];  Social History   Social History  . Marital status: Married    Spouse name: N/A  . Number of children: N/A  . Years of education: N/A   Occupational History  . Not on file.   Social History Main Topics  . Smoking status: Current Every Day Smoker    Packs/day: 0.25    Years: 5.00  . Smokeless tobacco: Former Systems developer  . Alcohol use 4.2 oz/week    7 Cans of beer per week  . Drug use: No  . Sexual activity: Not on file   Other Topics Concern  . Not on file   Social History Narrative  . No narrative on file    FAMILY HISTORY: No family history on file.  ALLERGIES:  has No Known Allergies.  MEDICATIONS:  Current Outpatient Prescriptions  Medication Sig Dispense  Refill  . aspirin 81 MG tablet Take 81 mg by mouth daily.    . hydroxyurea (HYDREA) 500 MG capsule Take one tablet (500 mg) daily. 90 capsule 1  . ibuprofen (ADVIL,MOTRIN) 200 MG tablet Take 200 mg by mouth every 6 (six) hours as needed.    Marland Kitchen amoxicillin (AMOXIL) 500 MG capsule Take 1 capsule by mouth as needed. During Tooth extractions  0  . chlorhexidine (PERIDEX) 0.12 % solution Use as directed 5 mLs in the mouth or throat 2 (two) times daily.   0  . gabapentin (NEURONTIN) 100 MG capsule Take 100 mg by mouth as needed.      No current facility-administered medications for this visit.       Marland Kitchen  PHYSICAL EXAMINATION: ECOG PERFORMANCE STATUS: 0 - Asymptomatic  Vitals:   11/18/16 1112 11/18/16 1115  BP: (!) 170/90 (!) 154/89  Pulse: 78 77  Resp: 18   Temp: 97.2 F (36.2 C)    Filed Weights   11/18/16 1112  Weight: 192 lb (87.1 kg)    GENERAL: Well-nourished well-developed; Alert, no distress and comfortable.   He is alone.   EYES: no pallor or icterus OROPHARYNX: no thrush or ulceration; good dentition  NECK: supple, no masses felt LYMPH:  no palpable lymphadenopathy in the cervical, axillary or inguinal regions LUNGS: clear to auscultation and  No wheeze or crackles HEART/CVS: regular  rate & rhythm and no murmurs; No lower extremity edema ABDOMEN: abdomen soft, non-tender and normal bowel sounds Musculoskeletal:no cyanosis of digits and no clubbing  PSYCH: alert & oriented x 3 with fluent speech NEURO: no focal motor/sensory deficits SKIN:  Approximately centimeter sized irregular mole noted on the left frontal scalp.   ASSESSMENT & PLAN:   Essential thrombocytosis ([place]) #  ESSENTIALTHROMBOCYTOSIS- CALR mutation positive; Continues to be an aspirin. Today platelets- 506; normal WBC/ Hemoglobin. Tolerating well; No side effects.  Continue Hydrea 500 mg once a day. New script given.   # Mild hyperkalemia- 5.3- monitor for now; increase fluids.  # Mole on left frontal  scalp- dermatology referral [free clinic on June 4th]; pt will let us know.   # Recommend follow-up in 4 months cbc/cmp [moved to [facility]; [place], MD 11/19/2016 8:57 AM

## 2016-11-18 NOTE — Assessment & Plan Note (Addendum)
#    ESSENTIALTHROMBOCYTOSIS- CALR mutation positive; Continues to be an aspirin. Today platelets- 506; normal WBC/ Hemoglobin. Tolerating well; No side effects.  Continue Hydrea 500 mg once a day. New script given.   # Mild hyperkalemia- 5.3- monitor for now; increase fluids.  # Mole on left frontal scalp- dermatology referral [free clinic on June 4th]; pt will let us know.   # Recommend follow-up in 4 months cbc/cmp [moved to [facility]; ]

## 2017-02-10 ENCOUNTER — Telehealth: Payer: Self-pay | Admitting: *Deleted

## 2017-02-10 NOTE — Telephone Encounter (Signed)
OK to come for labn this week. Patient requests Friday appt and agrees to 315 PM appt 7/27

## 2017-02-10 NOTE — Telephone Encounter (Signed)
Asking if he can move his appt up a month and have at least his labs checked this week (states he is going to be in town) since his last results in April were at the high end. Please advise

## 2017-02-11 NOTE — Telephone Encounter (Signed)
Appt has been scheduled - nothing further needed. Thanks!

## 2017-02-14 ENCOUNTER — Inpatient Hospital Stay: Payer: Medicare Other | Attending: Internal Medicine

## 2017-02-14 DIAGNOSIS — D473 Essential (hemorrhagic) thrombocythemia: Secondary | ICD-10-CM | POA: Insufficient documentation

## 2017-02-14 LAB — COMPREHENSIVE METABOLIC PANEL
ALBUMIN: 4.2 g/dL (ref 3.5–5.0)
ALT: 15 U/L — AB (ref 17–63)
AST: 23 U/L (ref 15–41)
Alkaline Phosphatase: 45 U/L (ref 38–126)
Anion gap: 9 (ref 5–15)
BUN: 14 mg/dL (ref 6–20)
CHLORIDE: 99 mmol/L — AB (ref 101–111)
CO2: 27 mmol/L (ref 22–32)
CREATININE: 1.07 mg/dL (ref 0.61–1.24)
Calcium: 9.3 mg/dL (ref 8.9–10.3)
GFR calc Af Amer: >60 mL/min (ref >60)
GFR calc non Af Amer: >60 mL/min (ref >60)
Glucose, Bld: 103 mg/dL — ABNORMAL HIGH (ref 65–99)
Potassium: 4.6 mmol/L (ref 3.5–5.1)
SODIUM: 135 mmol/L (ref 135–145)
Total Bilirubin: 0.8 mg/dL (ref 0.3–1.2)
Total Protein: 7.2 g/dL (ref 6.5–8.1)

## 2017-02-14 LAB — CBC WITH DIFFERENTIAL/PLATELET
Basophils Absolute: 0.1 10*3/uL (ref 0–0.1)
Basophils Relative: 1 %
EOS ABS: 0.1 10*3/uL (ref 0–0.7)
Eosinophils Relative: 1 %
HCT: 43.5 % (ref 40.0–52.0)
HEMOGLOBIN: 15.2 g/dL (ref 13.0–18.0)
LYMPHS ABS: 0.9 10*3/uL — AB (ref 1.0–3.6)
Lymphocytes Relative: 14 %
MCH: 35.3 pg — AB (ref 26.0–34.0)
MCHC: 35.1 g/dL (ref 32.0–36.0)
MCV: 100.6 fL — ABNORMAL HIGH (ref 80.0–100.0)
MONOS PCT: 7 %
Monocytes Absolute: 0.5 10*3/uL (ref 0.2–1.0)
NEUTROS PCT: 77 %
Neutro Abs: 5.2 10*3/uL (ref 1.4–6.5)
Platelets: 539 10*3/uL — ABNORMAL HIGH (ref 150–440)
RBC: 4.32 MIL/uL — ABNORMAL LOW (ref 4.40–5.90)
RDW: 12.9 % (ref 11.5–14.5)
WBC: 6.8 10*3/uL (ref 3.8–10.6)

## 2017-03-17 ENCOUNTER — Inpatient Hospital Stay: Payer: Medicare Other

## 2017-03-17 ENCOUNTER — Inpatient Hospital Stay: Payer: Medicare Other | Admitting: Internal Medicine

## 2017-03-18 ENCOUNTER — Inpatient Hospital Stay: Payer: Medicare Other | Attending: Internal Medicine | Admitting: Internal Medicine

## 2017-03-18 ENCOUNTER — Inpatient Hospital Stay: Payer: Medicare Other

## 2017-03-18 ENCOUNTER — Other Ambulatory Visit: Payer: Self-pay | Admitting: Internal Medicine

## 2017-03-18 VITALS — BP 146/78 | HR 81 | Temp 98.6°F | Resp 16 | Wt 192.6 lb

## 2017-03-18 DIAGNOSIS — D72819 Decreased white blood cell count, unspecified: Secondary | ICD-10-CM | POA: Diagnosis not present

## 2017-03-18 DIAGNOSIS — D473 Essential (hemorrhagic) thrombocythemia: Secondary | ICD-10-CM | POA: Insufficient documentation

## 2017-03-18 DIAGNOSIS — D224 Melanocytic nevi of scalp and neck: Secondary | ICD-10-CM | POA: Insufficient documentation

## 2017-03-18 DIAGNOSIS — Z7982 Long term (current) use of aspirin: Secondary | ICD-10-CM | POA: Diagnosis not present

## 2017-03-18 DIAGNOSIS — Z79899 Other long term (current) drug therapy: Secondary | ICD-10-CM | POA: Diagnosis not present

## 2017-03-18 DIAGNOSIS — D75839 Thrombocytosis, unspecified: Secondary | ICD-10-CM

## 2017-03-18 DIAGNOSIS — F17210 Nicotine dependence, cigarettes, uncomplicated: Secondary | ICD-10-CM | POA: Diagnosis not present

## 2017-03-18 LAB — CBC WITH DIFFERENTIAL/PLATELET
Basophils Absolute: 0.1 10*3/uL (ref 0–0.1)
Basophils Relative: 1 %
EOS ABS: 0.1 10*3/uL (ref 0–0.7)
EOS PCT: 1 %
HCT: 45.7 % (ref 40.0–52.0)
Hemoglobin: 15.8 g/dL (ref 13.0–18.0)
LYMPHS ABS: 1 10*3/uL (ref 1.0–3.6)
Lymphocytes Relative: 15 %
MCH: 35.1 pg — AB (ref 26.0–34.0)
MCHC: 34.5 g/dL (ref 32.0–36.0)
MCV: 101.6 fL — AB (ref 80.0–100.0)
MONO ABS: 0.5 10*3/uL (ref 0.2–1.0)
MONOS PCT: 7 %
Neutro Abs: 5 10*3/uL (ref 1.4–6.5)
Neutrophils Relative %: 76 %
PLATELETS: 579 10*3/uL — AB (ref 150–440)
RBC: 4.5 MIL/uL (ref 4.40–5.90)
RDW: 13.5 % (ref 11.5–14.5)
WBC: 6.6 10*3/uL (ref 3.8–10.6)

## 2017-03-18 LAB — COMPREHENSIVE METABOLIC PANEL
ALK PHOS: 44 U/L (ref 38–126)
ALT: 16 U/L — AB (ref 17–63)
AST: 25 U/L (ref 15–41)
Albumin: 4.3 g/dL (ref 3.5–5.0)
Anion gap: 7 (ref 5–15)
BILIRUBIN TOTAL: 0.7 mg/dL (ref 0.3–1.2)
BUN: 14 mg/dL (ref 6–20)
CALCIUM: 8.8 mg/dL — AB (ref 8.9–10.3)
CO2: 27 mmol/L (ref 22–32)
CREATININE: 1.03 mg/dL (ref 0.61–1.24)
Chloride: 104 mmol/L (ref 101–111)
Glucose, Bld: 99 mg/dL (ref 65–99)
Potassium: 4.3 mmol/L (ref 3.5–5.1)
Sodium: 138 mmol/L (ref 135–145)
Total Protein: 7.1 g/dL (ref 6.5–8.1)

## 2017-03-18 MED ORDER — HYDROXYUREA 500 MG PO CAPS: ORAL_CAPSULE | ORAL | 3 refills | Status: DC

## 2017-03-18 NOTE — Assessment & Plan Note (Addendum)
#    ESSENTIALTHROMBOCYTOSIS- CALR mutation positive; Continues to be an aspirin. Today platelets- 579; normal WBC/ Hemoglobin. Tolerating well; No side effects.   # Platelets continue to trend up/since going on Hydrea once a day. [Previously patient had been on Hydrea twice a day-dosed reduced because of leukopenia]. For now Continue Hydrea 500 mg once a day. However if they continue to rise- I would recommend adding extra Hydrea 3 times a week.   # Burning/feet- 1-2 episodes resolved. Question etiology- since it's not persistent I would not recommend going up on Hydrea at this time. Recommend taking an additional aspirin if his symptoms persist.  # Mole on left frontal scalp- awaiting dermatology evaluation.    # Recommend follow-up in 3 months cbc/cmp [moved to [facility]; [place]]. New prescription of Hydrea given.

## 2017-03-18 NOTE — Progress Notes (Signed)
[facility] NOTE  Patient Care Team: Karen Kitchens, MD as PCP - General (Family Medicine)  CHIEF COMPLAINTS/PURPOSE OF CONSULTATION:   Oncology History   # MARCH 2017- ESSENTIAL THROMBOCYTOSIS-CALR- POSITIVE; Intermediate risk [March 2017- BMBx- megakaryocytosis; mild reticulin fibrosis ; platelets- 510 to 623- June 2015] JAK-2 NEG;MPL-NEG; Bcr-abl-Negative. [id number]- START Hydrea 500mg  BID;   # jan 2018- reduced dose to 500 mg/day sec to leucopenia.      Essential thrombocytosis (HCC)     HISTORY OF PRESENTING ILLNESS:  Jordan Burgess 66 y.o.  male  is with diagnosis of ET is here for a follow up. Patient is currently on Hydrea once a day.   Patient had 1-2 episodes of burning pain tingling and numbness in his feet. Question attribute it to sternuous physical activity. Patient is taking one baby aspirin a day. The episodes have not recurred.  No history of any blood clots.  Patient is on aspirin once a day. Denies any fevers or chills. Denies any history of melanomas. She has not had his left forehead more takedown.  ROS: A complete 10 point review of system is done which is negative except mentioned above in history of present illness  MEDICAL HISTORY:  ET No past medical history on file.  SURGICAL HISTORY: Past Surgical History:  Procedure Laterality Date  . HERNIA REPAIR Left   . JOINT REPLACEMENT       SOCIAL HISTORY: no smoking, occasional alcohol.  Retired from city of [employer]; moved to [place].  Social History   Social History  . Marital status: Married    Spouse name: N/A  . Number of children: N/A  . Years of education: N/A   Occupational History  . Not on file.   Social History Main Topics  . Smoking status: Current Every Day Smoker    Packs/day: 0.25    Years: 5.00  . Smokeless tobacco: Former Systems developer  . Alcohol use 4.2 oz/week    7 Cans of beer per week  . Drug use: No  . Sexual activity: Not on file    Other Topics Concern  . Not on file   Social History Narrative  . No narrative on file    FAMILY HISTORY: No family history on file.  ALLERGIES:  has No Known Allergies.  MEDICATIONS:  Current Outpatient Prescriptions  Medication Sig Dispense Refill  . aspirin 81 MG tablet Take 81 mg by mouth daily.    . hydroxyurea (HYDREA) 500 MG capsule Take one tablet (500 mg) daily. 90 capsule 3  . ibuprofen (ADVIL,MOTRIN) 200 MG tablet Take 200 mg by mouth every 6 (six) hours as needed.    Marland Kitchen amoxicillin (AMOXIL) 500 MG capsule Take 1 capsule by mouth as needed. During Tooth extractions  0  . gabapentin (NEURONTIN) 100 MG capsule Take 100 mg by mouth as needed.      No current facility-administered medications for this visit.       Marland Kitchen  PHYSICAL EXAMINATION: ECOG PERFORMANCE STATUS: 0 - Asymptomatic  Vitals:   03/18/17 1348  BP: (!) 146/78  Pulse: 81  Resp: 16  Temp: 98.6 F (37 C)   Filed Weights   03/18/17 1348  Weight: 192 lb 9.2 oz (87.4 kg)    GENERAL: Well-nourished well-developed; Alert, no distress and comfortable.   He is alone.   EYES: no pallor or icterus OROPHARYNX: no thrush or ulceration; good dentition  NECK: supple, no masses felt LYMPH:  no palpable lymphadenopathy in  the cervical, axillary or inguinal regions LUNGS: clear to auscultation and  No wheeze or crackles HEART/CVS: regular rate & rhythm and no murmurs; No lower extremity edema ABDOMEN: abdomen soft, non-tender and normal bowel sounds Musculoskeletal:no cyanosis of digits and no clubbing  PSYCH: alert & oriented x 3 with fluent speech NEURO: no focal motor/sensory deficits SKIN:  Approximately centimeter sized irregular mole noted on the left frontal scalp.   ASSESSMENT & PLAN:   Essential thrombocytosis ([place]) #  ESSENTIALTHROMBOCYTOSIS- CALR mutation positive; Continues to be an aspirin. Today platelets- 579; normal WBC/ Hemoglobin. Tolerating well; No side effects.   # Platelets  continue to trend up/since going on Hydrea once a day. [Previously patient had been on Hydrea twice a day-dosed reduced because of leukopenia]. For now Continue Hydrea 500 mg once a day. However if they continue to rise- I would recommend adding extra Hydrea 3 times a week.   # Burning/feet- 1-2 episodes resolved. Question etiology- since it's not persistent I would not recommend going up on Hydrea at this time. Recommend taking an additional aspirin if his symptoms persist.  # Mole on left frontal scalp- awaiting dermatology evaluation.    # Recommend follow-up in 3 months cbc/cmp [moved to [facility]; [place]]. New prescription of Hydrea given.   Cammie Sickle, MD 03/18/2017 2:43 PM

## 2017-06-17 ENCOUNTER — Inpatient Hospital Stay: Payer: Medicare Other

## 2017-06-17 ENCOUNTER — Inpatient Hospital Stay: Payer: Medicare Other | Attending: Internal Medicine | Admitting: Internal Medicine

## 2017-06-17 ENCOUNTER — Encounter: Payer: Self-pay | Admitting: Internal Medicine

## 2017-06-17 VITALS — BP 165/87 | HR 74 | Temp 98.9°F | Resp 16 | Wt 190.3 lb

## 2017-06-17 DIAGNOSIS — F17210 Nicotine dependence, cigarettes, uncomplicated: Secondary | ICD-10-CM | POA: Diagnosis not present

## 2017-06-17 DIAGNOSIS — D473 Essential (hemorrhagic) thrombocythemia: Secondary | ICD-10-CM | POA: Insufficient documentation

## 2017-06-17 DIAGNOSIS — D75839 Thrombocytosis, unspecified: Secondary | ICD-10-CM

## 2017-06-17 DIAGNOSIS — D224 Melanocytic nevi of scalp and neck: Secondary | ICD-10-CM | POA: Diagnosis not present

## 2017-06-17 DIAGNOSIS — M79671 Pain in right foot: Secondary | ICD-10-CM

## 2017-06-17 DIAGNOSIS — Z7982 Long term (current) use of aspirin: Secondary | ICD-10-CM | POA: Insufficient documentation

## 2017-06-17 DIAGNOSIS — Z79899 Other long term (current) drug therapy: Secondary | ICD-10-CM | POA: Insufficient documentation

## 2017-06-17 DIAGNOSIS — M79672 Pain in left foot: Secondary | ICD-10-CM

## 2017-06-17 LAB — COMPREHENSIVE METABOLIC PANEL
ALBUMIN: 4.3 g/dL (ref 3.5–5.0)
ALK PHOS: 56 U/L (ref 38–126)
ALT: 16 U/L — ABNORMAL LOW (ref 17–63)
ANION GAP: 6 (ref 5–15)
AST: 23 U/L (ref 15–41)
BILIRUBIN TOTAL: 0.8 mg/dL (ref 0.3–1.2)
BUN: 13 mg/dL (ref 6–20)
CALCIUM: 8.7 mg/dL — AB (ref 8.9–10.3)
CO2: 28 mmol/L (ref 22–32)
Chloride: 102 mmol/L (ref 101–111)
Creatinine, Ser: 0.85 mg/dL (ref 0.61–1.24)
GFR calc Af Amer: >60 mL/min (ref >60)
GFR calc non Af Amer: >60 mL/min (ref >60)
GLUCOSE: 100 mg/dL — AB (ref 65–99)
POTASSIUM: 4.6 mmol/L (ref 3.5–5.1)
SODIUM: 136 mmol/L (ref 135–145)
Total Protein: 7.7 g/dL (ref 6.5–8.1)

## 2017-06-17 LAB — CBC WITH DIFFERENTIAL/PLATELET
Basophils Absolute: 0.1 10*3/uL (ref 0–0.1)
Basophils Relative: 1 %
EOS PCT: 1 %
Eosinophils Absolute: 0.1 10*3/uL (ref 0–0.7)
HEMATOCRIT: 46.8 % (ref 40.0–52.0)
Hemoglobin: 16 g/dL (ref 13.0–18.0)
LYMPHS PCT: 14 %
Lymphs Abs: 0.9 10*3/uL — ABNORMAL LOW (ref 1.0–3.6)
MCH: 34.6 pg — ABNORMAL HIGH (ref 26.0–34.0)
MCHC: 34.1 g/dL (ref 32.0–36.0)
MCV: 101.5 fL — AB (ref 80.0–100.0)
MONO ABS: 0.4 10*3/uL (ref 0.2–1.0)
MONOS PCT: 6 %
NEUTROS ABS: 4.8 10*3/uL (ref 1.4–6.5)
Neutrophils Relative %: 78 %
PLATELETS: 535 10*3/uL — AB (ref 150–440)
RBC: 4.62 MIL/uL (ref 4.40–5.90)
RDW: 13.5 % (ref 11.5–14.5)
WBC: 6.3 10*3/uL (ref 3.8–10.6)

## 2017-06-17 MED ORDER — HYDROXYUREA 500 MG PO CAPS: ORAL_CAPSULE | ORAL | 3 refills | Status: DC

## 2017-06-17 NOTE — Assessment & Plan Note (Addendum)
#    ESSENTIALTHROMBOCYTOSIS- CALR mutation positive; Continues to be an aspirin. Today platelets- 539; normal WBC/ Hemoglobin. Tolerating well; No side effects; but continues to notice "burning feet"  # For now Continue Hydrea 500 mg once a day; but add extra Hydrea 3 times a week. New script given.   # "Burning feet" bilateral- unclear secondary to ET versus others. C plan as above.  # Mole on left frontal scalp- awaiting dermatology evaluation.    # follow up in 4 months/ labs.

## 2017-06-17 NOTE — Progress Notes (Signed)
[place] NOTE  Patient Care Team: Karen Kitchens, MD as PCP - General (Family Medicine)  CHIEF COMPLAINTS/PURPOSE OF CONSULTATION:   Oncology History   # MARCH 2017- ESSENTIAL THROMBOCYTOSIS-CALR- POSITIVE; Intermediate risk [March 2017- BMBx- megakaryocytosis; mild reticulin fibrosis ; platelets- 510 to 623- June 2015] JAK-2 NEG;MPL-NEG; Bcr-abl-Negative. [id number]- START Hydrea 500mg  BID;   # jan 2018- reduced dose to 500 mg/day sec to leucopenia.      Essential thrombocytosis (HCC)     HISTORY OF PRESENTING ILLNESS:  Jordan Burgess 66 y.o.  male  is with diagnosis of ET is here for a follow up. Patient is currently on Hydrea once a day.   Patient continues 53 with episodes of burning pain in his feet. However he states this is secondary to physical activity. Patient is taking one baby aspirin a day.  No history of any blood clots.  Patient is on aspirin once a day. Denies any fevers or chills. Denies any history of melanomas. He has not had his left forehead more takedown.  ROS: A complete 10 point review of system is done which is negative except mentioned above in history of present illness  MEDICAL HISTORY:  ET History reviewed. No pertinent past medical history.  SURGICAL HISTORY: Past Surgical History:  Procedure Laterality Date  . HERNIA REPAIR Left   . JOINT REPLACEMENT       SOCIAL HISTORY: no smoking, occasional alcohol.  Retired from city of [employer]; moved to [place].  Social History   Socioeconomic History  . Marital status: Married    Spouse name: Not on file  . Number of children: Not on file  . Years of education: Not on file  . Highest education level: Not on file  Social Needs  . Financial resource strain: Not on file  . Food insecurity - worry: Not on file  . Food insecurity - inability: Not on file  . Transportation needs - medical: Not on file  . Transportation needs - non-medical: Not on file   Occupational History  . Not on file  Tobacco Use  . Smoking status: Current Every Day Smoker    Packs/day: 0.25    Years: 5.00    Pack years: 1.25  . Smokeless tobacco: Former Network engineer and Sexual Activity  . Alcohol use: Yes    Alcohol/week: 4.2 oz    Types: 7 Cans of beer per week  . Drug use: No  . Sexual activity: Not on file  Other Topics Concern  . Not on file  Social History Narrative  . Not on file    FAMILY HISTORY: History reviewed. No pertinent family history.  ALLERGIES:  has No Known Allergies.  MEDICATIONS:  Current Outpatient Medications  Medication Sig Dispense Refill  . aspirin 81 MG tablet Take 81 mg by mouth daily.    . hydroxyurea (HYDREA) 500 MG capsule Take one tablet (500 mg) daily. Take one extra pill on Monday-Wed-Friday. 120 capsule 3  . ibuprofen (ADVIL,MOTRIN) 200 MG tablet Take 200 mg by mouth every 6 (six) hours as needed.    . gabapentin (NEURONTIN) 100 MG capsule Take 100 mg by mouth as needed.      No current facility-administered medications for this visit.       Marland Kitchen  PHYSICAL EXAMINATION: ECOG PERFORMANCE STATUS: 0 - Asymptomatic  Vitals:   06/17/17 1409  BP: (!) 165/87  Pulse: 74  Resp: 16  Temp: 98.9 F (37.2 C)   Filed  Weights   06/17/17 1407 06/17/17 1409  Weight: 190 lb 4.1 oz (86.3 kg) 190 lb 4.1 oz (86.3 kg)    GENERAL: Well-nourished well-developed; Alert, no distress and comfortable.   He is alone.   EYES: no pallor or icterus OROPHARYNX: no thrush or ulceration; good dentition  NECK: supple, no masses felt LYMPH:  no palpable lymphadenopathy in the cervical, axillary or inguinal regions LUNGS: clear to auscultation and  No wheeze or crackles HEART/CVS: regular rate & rhythm and no murmurs; No lower extremity edema ABDOMEN: abdomen soft, non-tender and normal bowel sounds Musculoskeletal:no cyanosis of digits and no clubbing  PSYCH: alert & oriented x 3 with fluent speech NEURO: no focal motor/sensory  deficits SKIN:  Approximately centimeter sized irregular mole noted on the left frontal scalp.   ASSESSMENT & PLAN:   Essential thrombocytosis ([place]) #  ESSENTIALTHROMBOCYTOSIS- CALR mutation positive; Continues to be an aspirin. Today platelets- 539; normal WBC/ Hemoglobin. Tolerating well; No side effects; but continues to notice "burning feet"  # For now Continue Hydrea 500 mg once a day; but add extra Hydrea 3 times a week. New script given.   # "Burning feet" bilateral- unclear secondary to ET versus others. C plan as above.  # Mole on left frontal scalp- awaiting dermatology evaluation.    # follow up in 4 months/ labs.    Cammie Sickle, MD 06/17/2017 6:41 PM

## 2017-09-18 IMAGING — CT CT BIOPSY
1 of 2 series · 14 of 32 positions shown, 19 images · non-contrast
Comparison: none

INDICATION: Thrombocytosis.

[Series 3: osteo bx 2.4 b70s · axial · 0.74mm/px · z∈[+868,+883]mm · 14 of 30 slices shown, 19 images]
[im 2/30  soft-tissue]
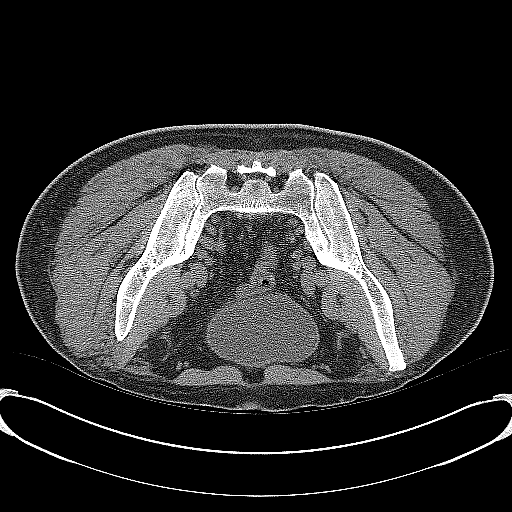
[im 2/30  bone]
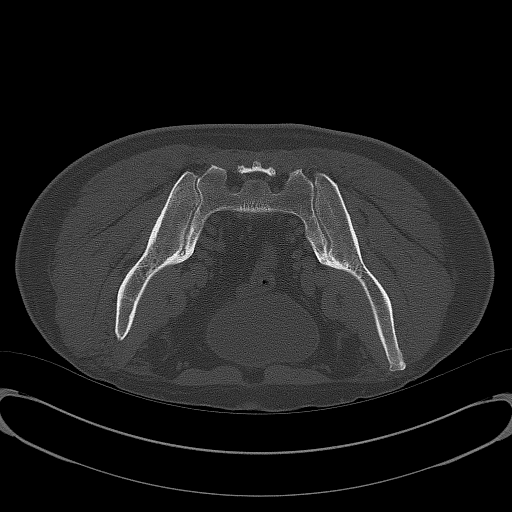
[im 4/30  soft-tissue]
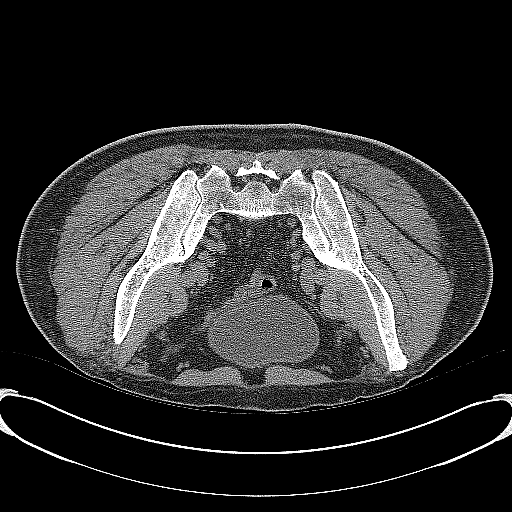
[im 7/30  soft-tissue]
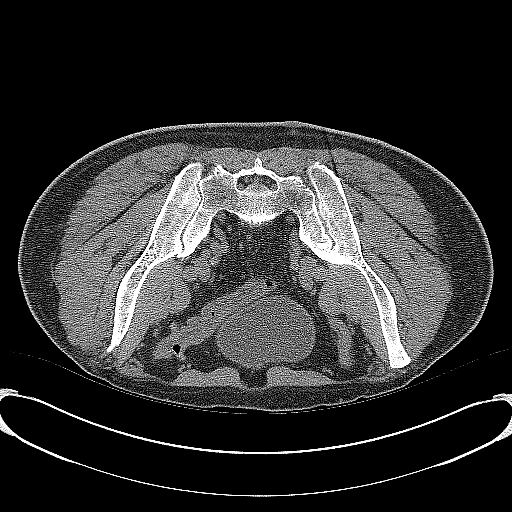
[im 9/30  soft-tissue]
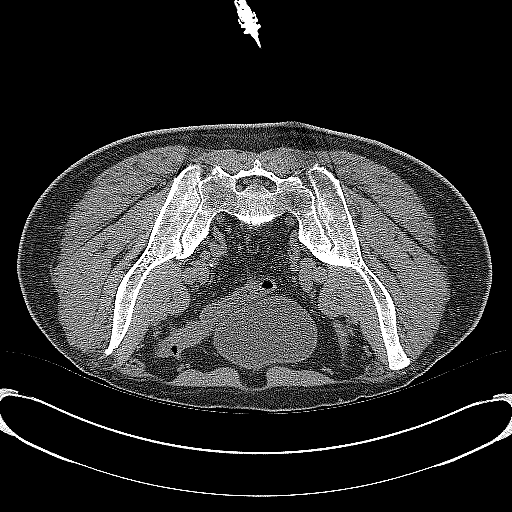
[im 11/30  soft-tissue]
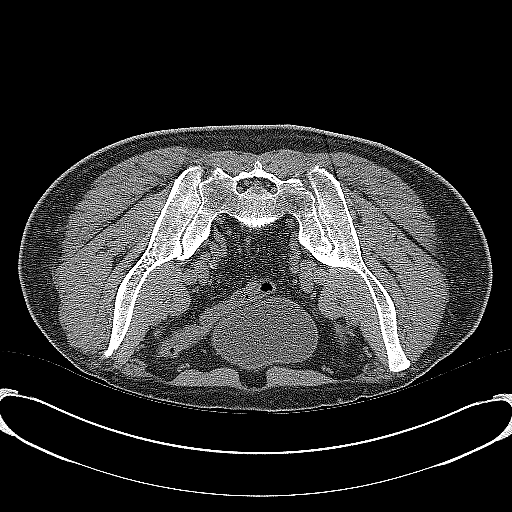
[im 12/30  soft-tissue]
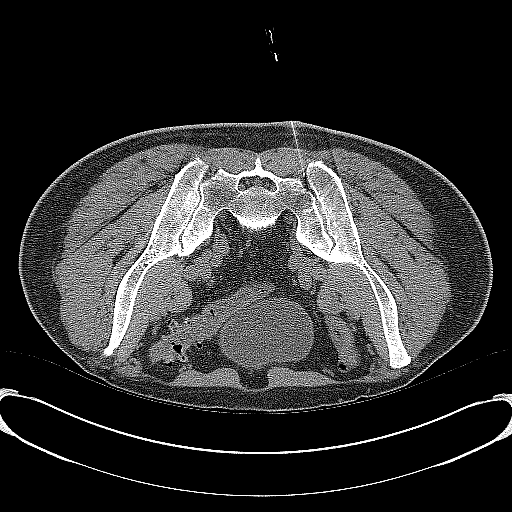
[im 16/30  soft-tissue]
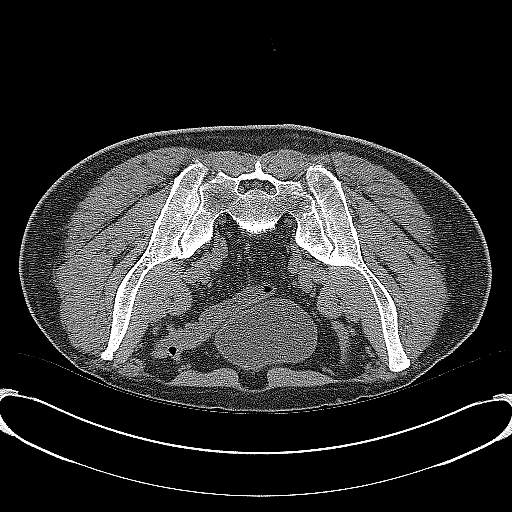
[im 18/30  soft-tissue]
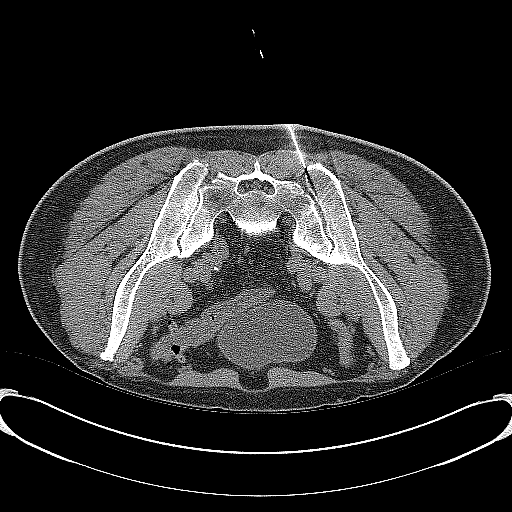
[im 19/30  soft-tissue]
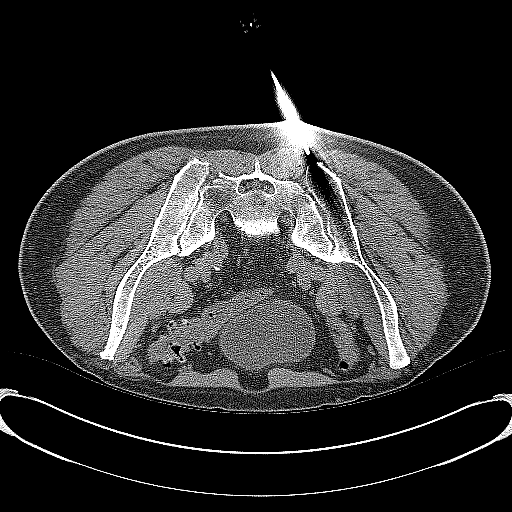
[im 19/30  bone]
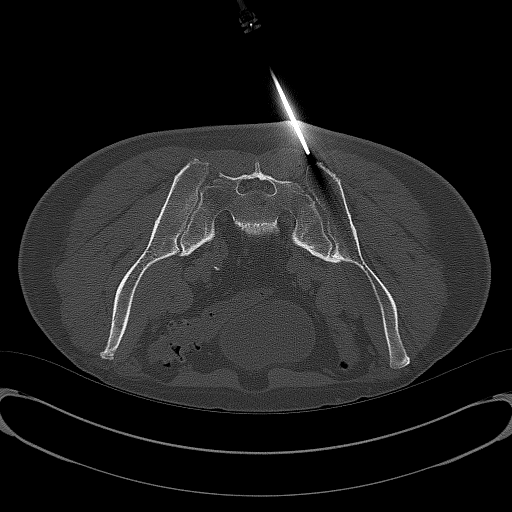
[im 21/30  soft-tissue]
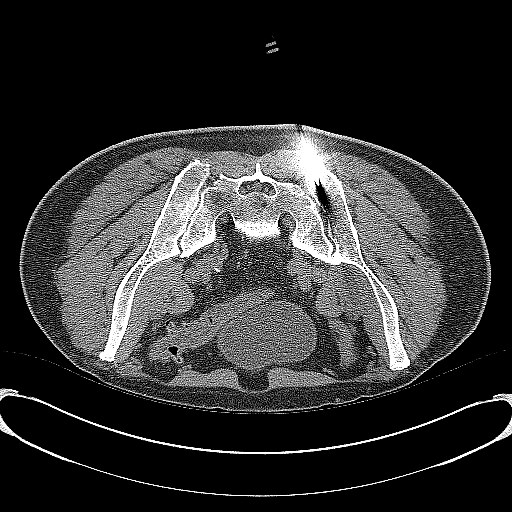
[im 23/30  soft-tissue]
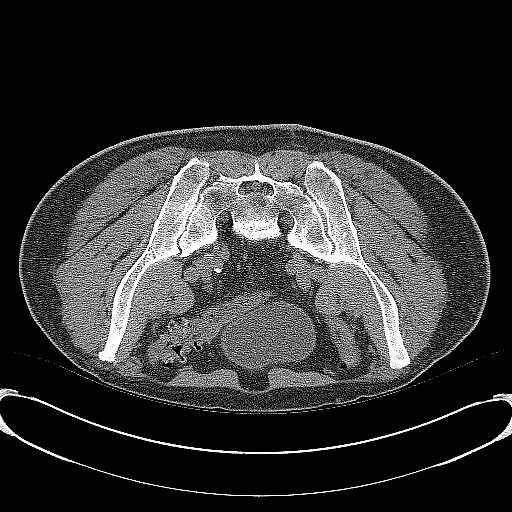
[im 23/30  lung]
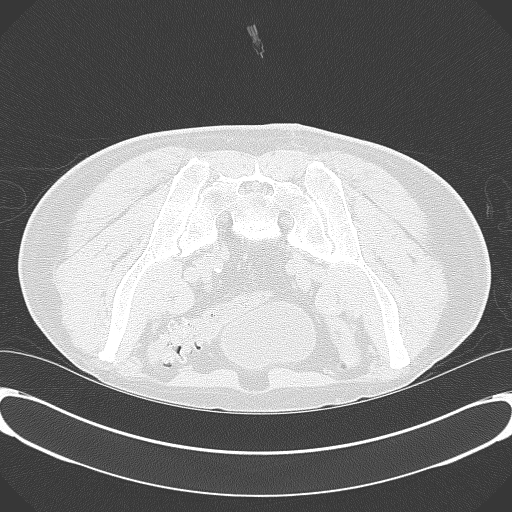
[im 24/30  lung]
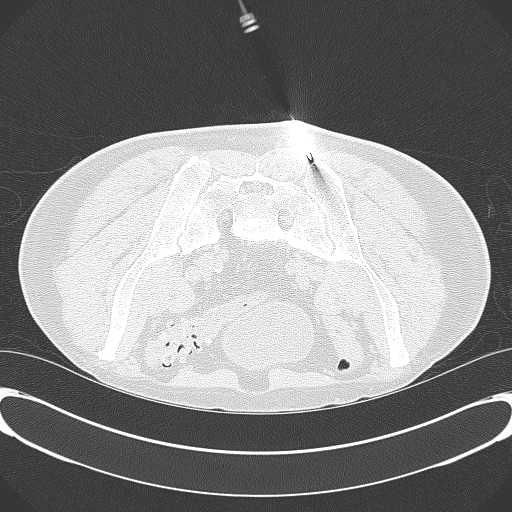
[im 26/30  soft-tissue]
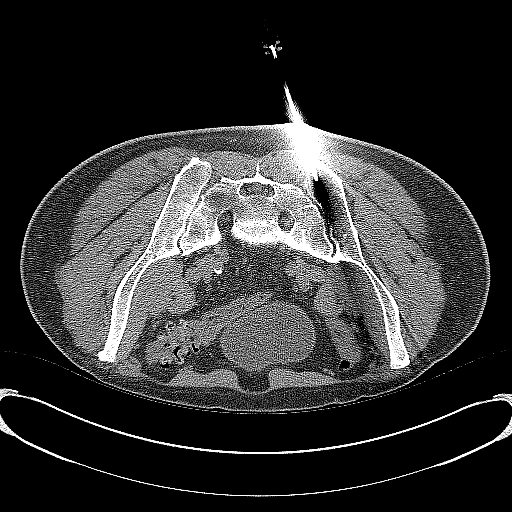
[im 26/30  lung]
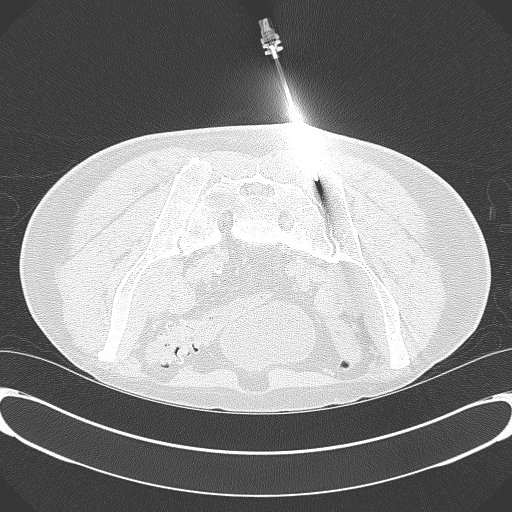
[im 28/30  soft-tissue]
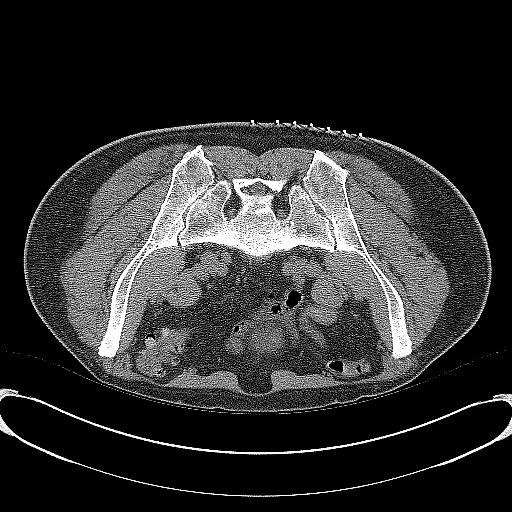
[im 28/30  lung]
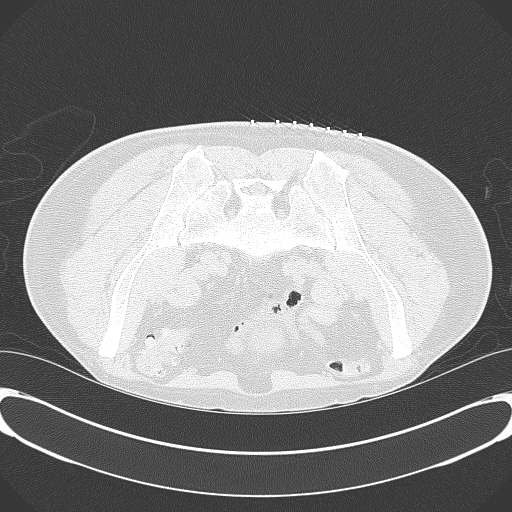

[14 of 32 positions shown; findings below may reference images not displayed]

EXAM:
CT BIOPSY

MEDICATIONS:
None.

ANESTHESIA/SEDATION:
Moderate (conscious) sedation was employed during this procedure. A
total of Versed 1 mg and Fentanyl 50 mcg was administered
intravenously.

Moderate Sedation Time: 12 minutes. The patient's level of
consciousness and vital signs were monitored continuously by
radiology nursing throughout the procedure under my direct
supervision.

COMPLICATIONS:
None immediate.

PROCEDURE:
Informed written consent was obtained from the patient after a
thorough discussion of the procedural risks, benefits and
alternatives. All questions were addressed. Sterile Barrier
Technique was utilized including mask,sterile gloves, sterile drape,
hand hygiene and skin antiseptic. A timeout was performed prior to
the initiation of the procedure.

Right posterior flank region was prepped and draped using sterile
technique. Local anesthetic was applied. Under CT guidance, 22 gauge
spinal needle was directed toward posterior margin of right iliac
bone. Lidocaine was injected subperiosteally. Then the needle was
removed. Then, also under CT guidance, trocar was directed through
the posterior margin of the right iliac bone. Bone marrow aspirates
with and without heparin were obtained and given to the pathology
technologist who was present at that time. Then, bone marrow core
biopsy was obtained and also given to the pathology technologist.
Appropriate dressing was applied. No immediate complications were
noted.
IMPRESSION: Under CT guidance, percutaneous bone marrow aspiration and core
biopsy of right iliac bone was performed.

## 2017-09-30 ENCOUNTER — Inpatient Hospital Stay: Payer: Medicare Other | Attending: Internal Medicine

## 2017-09-30 ENCOUNTER — Inpatient Hospital Stay (HOSPITAL_BASED_OUTPATIENT_CLINIC_OR_DEPARTMENT_OTHER): Payer: Medicare Other | Admitting: Internal Medicine

## 2017-09-30 ENCOUNTER — Encounter: Payer: Self-pay | Admitting: Internal Medicine

## 2017-09-30 ENCOUNTER — Other Ambulatory Visit: Payer: Self-pay

## 2017-09-30 DIAGNOSIS — D473 Essential (hemorrhagic) thrombocythemia: Secondary | ICD-10-CM

## 2017-09-30 DIAGNOSIS — D75839 Thrombocytosis, unspecified: Secondary | ICD-10-CM

## 2017-09-30 DIAGNOSIS — D224 Melanocytic nevi of scalp and neck: Secondary | ICD-10-CM | POA: Insufficient documentation

## 2017-09-30 DIAGNOSIS — F17210 Nicotine dependence, cigarettes, uncomplicated: Secondary | ICD-10-CM | POA: Diagnosis not present

## 2017-09-30 LAB — CBC WITH DIFFERENTIAL/PLATELET
BASOS PCT: 1 %
Basophils Absolute: 0 10*3/uL (ref 0–0.1)
EOS ABS: 0.1 10*3/uL (ref 0–0.7)
Eosinophils Relative: 1 %
HEMATOCRIT: 45.9 % (ref 40.0–52.0)
HEMOGLOBIN: 16.2 g/dL (ref 13.0–18.0)
LYMPHS ABS: 0.8 10*3/uL — AB (ref 1.0–3.6)
Lymphocytes Relative: 13 %
MCH: 36.8 pg — ABNORMAL HIGH (ref 26.0–34.0)
MCHC: 35.2 g/dL (ref 32.0–36.0)
MCV: 104.5 fL — ABNORMAL HIGH (ref 80.0–100.0)
Monocytes Absolute: 0.4 10*3/uL (ref 0.2–1.0)
Monocytes Relative: 6 %
NEUTROS ABS: 4.9 10*3/uL (ref 1.4–6.5)
Neutrophils Relative %: 79 %
Platelets: 490 10*3/uL — ABNORMAL HIGH (ref 150–440)
RBC: 4.39 MIL/uL — AB (ref 4.40–5.90)
RDW: 14 % (ref 11.5–14.5)
WBC: 6.2 10*3/uL (ref 3.8–10.6)

## 2017-09-30 LAB — COMPREHENSIVE METABOLIC PANEL
ALT: 18 U/L (ref 17–63)
ANION GAP: 8 (ref 5–15)
AST: 24 U/L (ref 15–41)
Albumin: 4.4 g/dL (ref 3.5–5.0)
Alkaline Phosphatase: 51 U/L (ref 38–126)
BUN: 11 mg/dL (ref 6–20)
CALCIUM: 9 mg/dL (ref 8.9–10.3)
CHLORIDE: 103 mmol/L (ref 101–111)
CO2: 28 mmol/L (ref 22–32)
CREATININE: 0.95 mg/dL (ref 0.61–1.24)
GFR calc Af Amer: >60 mL/min (ref >60)
Glucose, Bld: 99 mg/dL (ref 65–99)
Potassium: 4.4 mmol/L (ref 3.5–5.1)
SODIUM: 139 mmol/L (ref 135–145)
Total Bilirubin: 0.9 mg/dL (ref 0.3–1.2)
Total Protein: 7.6 g/dL (ref 6.5–8.1)

## 2017-09-30 LAB — LACTATE DEHYDROGENASE: LDH: 162 U/L (ref 98–192)

## 2017-09-30 MED ORDER — HYDROXYUREA 500 MG PO CAPS: ORAL_CAPSULE | ORAL | 3 refills | Status: AC

## 2017-09-30 NOTE — Progress Notes (Signed)
[place] NOTE  Patient Care Team: Karen Kitchens, MD as PCP - General (Family Medicine)  CHIEF COMPLAINTS/PURPOSE OF CONSULTATION:   Oncology History   # MARCH 2017- ESSENTIAL THROMBOCYTOSIS-CALR- POSITIVE; Intermediate risk [March 2017- BMBx- megakaryocytosis; mild reticulin fibrosis ; platelets- 510 to 623- June 2015] JAK-2 NEG;MPL-NEG; Bcr-abl-Negative. [id number]- START Hydrea 500mg  BID;   # jan 2018- reduced dose to 500 mg/day sec to leucopenia.      Essential thrombocytosis (HCC)     HISTORY OF PRESENTING ILLNESS:  Jordan Burgess 67 y.o.  male  is with diagnosis of ET is here for a follow up. Patient is currently on Hydrea once a day.   Patient unfortunately has not had evaluation of his mole on his left frontal scalp.  No history of any blood clots.  Patient is on aspirin once a day. Denies any fevers or chills.   ROS: A complete 10 point review of system is done which is negative except mentioned above in history of present illness  MEDICAL HISTORY:  ET History reviewed. No pertinent past medical history.  SURGICAL HISTORY: Past Surgical History:  Procedure Laterality Date  . HERNIA REPAIR Left   . JOINT REPLACEMENT       SOCIAL HISTORY: no smoking, occasional alcohol.  Retired from city of [employer]; moved to [place].  Social History   Socioeconomic History  . Marital status: Married    Spouse name: Not on file  . Number of children: Not on file  . Years of education: Not on file  . Highest education level: Not on file  Social Needs  . Financial resource strain: Not on file  . Food insecurity - worry: Not on file  . Food insecurity - inability: Not on file  . Transportation needs - medical: Not on file  . Transportation needs - non-medical: Not on file  Occupational History  . Not on file  Tobacco Use  . Smoking status: Current Every Day Smoker    Packs/day: 0.25    Years: 5.00    Pack years: 1.25  .  Smokeless tobacco: Former Network engineer and Sexual Activity  . Alcohol use: Yes    Alcohol/week: 4.2 oz    Types: 7 Cans of beer per week  . Drug use: No  . Sexual activity: Not on file  Other Topics Concern  . Not on file  Social History Narrative  . Not on file    FAMILY HISTORY: History reviewed. No pertinent family history.  ALLERGIES:  has No Known Allergies.  MEDICATIONS:  Current Outpatient Medications  Medication Sig Dispense Refill  . aspirin 81 MG tablet Take 81 mg by mouth daily.    Marland Kitchen gabapentin (NEURONTIN) 100 MG capsule Take 100 mg by mouth as needed.     . hydroxyurea (HYDREA) 500 MG capsule Take one tablet (500 mg) daily. Take one extra pill on Monday-Wed-Friday. 120 capsule 3  . ibuprofen (ADVIL,MOTRIN) 200 MG tablet Take 200 mg by mouth every 6 (six) hours as needed.     No current facility-administered medications for this visit.       Marland Kitchen  PHYSICAL EXAMINATION: ECOG PERFORMANCE STATUS: 0 - Asymptomatic  Vitals:   09/30/17 1411  BP: (!) 156/88  Pulse: 69  Resp: 20  Temp: (!) 97 F (36.1 C)   Filed Weights   09/30/17 1411  Weight: 197 lb (89.4 kg)    GENERAL: Well-nourished well-developed; Alert, no distress and comfortable.   He is  alone.   EYES: no pallor or icterus OROPHARYNX: no thrush or ulceration; good dentition  NECK: supple, no masses felt LYMPH:  no palpable lymphadenopathy in the cervical, axillary or inguinal regions LUNGS: clear to auscultation and  No wheeze or crackles HEART/CVS: regular rate & rhythm and no murmurs; No lower extremity edema ABDOMEN: abdomen soft, non-tender and normal bowel sounds Musculoskeletal:no cyanosis of digits and no clubbing  PSYCH: alert & oriented x 3 with fluent speech NEURO: no focal motor/sensory deficits SKIN:  Approximately centimeter sized irregular mole noted on the left frontal scalp [unchanged]   ASSESSMENT & PLAN:   Essential thrombocytosis (HCC) #  ESSENTIALTHROMBOCYTOSIS- CALR  mutation positive; Continues to be an aspirin. Today platelets- 490; normal WBC/ Hemoglobin.   # For now Continue Hydrea 500 mg once a day; but add extra Hydrea 3 times a week. New script given.  # Mole on left frontal scalp- awaiting dermatology evaluation.  Re-iterated the importance of follow-up dermatology.  # follow up in 6 months/ labs- cbc/cmp-ldh   Cammie Sickle, MD 10/02/2017 6:56 AM

## 2017-09-30 NOTE — Assessment & Plan Note (Addendum)
#    ESSENTIALTHROMBOCYTOSIS- CALR mutation positive; Continues to be an aspirin. Today platelets- 490; normal WBC/ Hemoglobin.   # For now Continue Hydrea 500 mg once a day; but add extra Hydrea 3 times a week. New script given.  # Mole on left frontal scalp- awaiting dermatology evaluation.  Re-iterated the importance of follow-up dermatology.  # follow up in 6 months/ labs- cbc/cmp-ldh
